# Patient Record
Sex: Female | Born: 1975 | Race: White | Hispanic: No | Marital: Married | Smoking: Never smoker
Health system: Southern US, Community
[De-identification: ages and names within clinical notes are randomized; demographics above are authoritative.]

## PROBLEM LIST (undated history)

## (undated) DIAGNOSIS — D649 Anemia, unspecified: Secondary | ICD-10-CM

## (undated) DIAGNOSIS — J45909 Unspecified asthma, uncomplicated: Secondary | ICD-10-CM

## (undated) DIAGNOSIS — M722 Plantar fascial fibromatosis: Secondary | ICD-10-CM

## (undated) DIAGNOSIS — K219 Gastro-esophageal reflux disease without esophagitis: Secondary | ICD-10-CM

## (undated) DIAGNOSIS — T7840XA Allergy, unspecified, initial encounter: Secondary | ICD-10-CM

## (undated) HISTORY — DX: Plantar fascial fibromatosis: M72.2

## (undated) HISTORY — DX: Anemia, unspecified: D64.9

## (undated) HISTORY — PX: FRACTURE SURGERY: SHX138

## (undated) HISTORY — DX: Unspecified asthma, uncomplicated: J45.909

## (undated) HISTORY — DX: Allergy, unspecified, initial encounter: T78.40XA

## (undated) HISTORY — DX: Gastro-esophageal reflux disease without esophagitis: K21.9

---

## 2005-10-09 HISTORY — PX: FOOT SURGERY: SHX648

## 2014-10-09 HISTORY — PX: OTHER SURGICAL HISTORY: SHX169

## 2020-01-01 DIAGNOSIS — Z23 Encounter for immunization: Secondary | ICD-10-CM | POA: Diagnosis not present

## 2020-01-24 DIAGNOSIS — Z23 Encounter for immunization: Secondary | ICD-10-CM | POA: Diagnosis not present

## 2020-07-28 ENCOUNTER — Ambulatory Visit (INDEPENDENT_AMBULATORY_CARE_PROVIDER_SITE_OTHER): Payer: BC Managed Care – PPO | Admitting: Physician Assistant

## 2020-07-28 ENCOUNTER — Other Ambulatory Visit: Payer: Self-pay

## 2020-07-28 ENCOUNTER — Encounter: Payer: Self-pay | Admitting: Physician Assistant

## 2020-07-28 VITALS — BP 120/90 | HR 75 | Temp 97.9°F | Ht 66.25 in | Wt 273.2 lb

## 2020-07-28 DIAGNOSIS — R21 Rash and other nonspecific skin eruption: Secondary | ICD-10-CM | POA: Diagnosis not present

## 2020-07-28 DIAGNOSIS — Z23 Encounter for immunization: Secondary | ICD-10-CM

## 2020-07-28 DIAGNOSIS — H6983 Other specified disorders of Eustachian tube, bilateral: Secondary | ICD-10-CM | POA: Diagnosis not present

## 2020-07-28 DIAGNOSIS — R0989 Other specified symptoms and signs involving the circulatory and respiratory systems: Secondary | ICD-10-CM | POA: Diagnosis not present

## 2020-07-28 DIAGNOSIS — J4530 Mild persistent asthma, uncomplicated: Secondary | ICD-10-CM | POA: Diagnosis not present

## 2020-07-28 MED ORDER — FLOVENT HFA 110 MCG/ACT IN AERO: 1.0000 | INHALATION_SPRAY | Freq: Two times a day (BID) | RESPIRATORY_TRACT | 12 refills | Status: DC

## 2020-07-28 MED ORDER — TRIAMCINOLONE ACETONIDE 0.1 % EX CREA: TOPICAL_CREAM | CUTANEOUS | 0 refills | Status: DC

## 2020-07-28 MED ORDER — MONTELUKAST SODIUM 10 MG PO TABS: 10.0000 mg | ORAL_TABLET | Freq: Every day | ORAL | 2 refills | Status: DC

## 2020-07-28 MED ORDER — ALBUTEROL SULFATE HFA 108 (90 BASE) MCG/ACT IN AERS: 2.0000 | INHALATION_SPRAY | Freq: Four times a day (QID) | RESPIRATORY_TRACT | 5 refills | Status: DC | PRN

## 2020-07-28 NOTE — Progress Notes (Signed)
Kristina Garza is a 44 y.o. female is here to establish care and discuss asthma.  I acted as a Neurosurgeon for [employer], PA-C Corky Mull, LPN   History of Present Illness:   Chief Complaint  Patient presents with  . Establish Care  . Asthma  . Rash    HPI   Pt is here to establish care today.  Asthma Pt was dx in 2016, she would like to discuss medications for her asthma. Pt has been off medications since May, was taking Singulair, Flovent diskus, Zyrtec, ProAir inhaler as needed. She is now currently taking ProAir prn and using less than weekly. She does continue to take Zyrtec. She has noticed that as fall has come around she has had increase in draining in her throat. When laughing she has to cough frequently. She has difficulty breathing at times when she is talking a lot, which she is required to do for her job.   She does note that that if she eats certain sauces, she gets excessive mucous production that causes coughing/choking. This can happen with ranch dressing or other sauces such as Japanese white cause. Will sometimes have to use her inhaler during these episodes.  Prior asthma meds include QVAR -- made her gums bleed  Rash Pt c/o rash left ring finger x several days. Red and itching, painful. This is in the location of wear her wedding ring was. She was recently active and thinks that she had increased sweating in this area and it caused irritation. She has had this in the past with metals. Has not tried anything for her symptoms.  Ear fullness Uses headset to do her job. She has issues with sensation of ear fullness. She denies pain, recent URI, drainage from ears.  Health Maintenance Due  Topic Date Due  . Hepatitis C Screening  Never done  . HIV Screening  Never done  . PAP SMEAR-Modifier  Never done  . INFLUENZA VACCINE  Never done    Past Medical History:  Diagnosis Date  . Allergy   . Asthma      Social History   Tobacco Use  . Smoking  status: Never Smoker  . Smokeless tobacco: Never Used  Vaping Use  . Vaping Use: Never used  Substance Use Topics  . Alcohol use: Yes    Alcohol/week: 0.0 - 2.0 standard drinks  . Drug use: Never    Past Surgical History:  Procedure Laterality Date  . FOOT SURGERY Left 2007   5th Metatarsal broken pin placed  . Knee surgery Right 2016   miniscus removed    Family History  Problem Relation Age of Onset  . Asthma Mother   . Osteoarthritis Mother   . Hyperlipidemia Father   . Hypertension Father   . Heart attack Father   . Hyperlipidemia Paternal Grandmother   . Hypertension Paternal Grandmother   . Heart attack Paternal Grandmother     PMHx, SurgHx, SocialHx, FamHx, Medications, and Allergies were reviewed in the Visit Navigator and updated as appropriate.   There are no problems to display for this patient.   Social History   Tobacco Use  . Smoking status: Never Smoker  . Smokeless tobacco: Never Used  Vaping Use  . Vaping Use: Never used  Substance Use Topics  . Alcohol use: Yes    Alcohol/week: 0.0 - 2.0 standard drinks  . Drug use: Never    Current Medications and Allergies:    Current Outpatient Medications:  .  albuterol (PROAIR HFA) 108 (90 Base) MCG/ACT inhaler, Inhale 2 puffs into the lungs every 6 (six) hours as needed for wheezing or shortness of breath., Disp: 8 g, Rfl: 5 .  cetirizine (ZYRTEC ALLERGY) 10 MG tablet, , Disp: , Rfl:  .  fluticasone (FLOVENT HFA) 110 MCG/ACT inhaler, Inhale 1 puff into the lungs in the morning and at bedtime., Disp: 1 each, Rfl: 12 .  montelukast (SINGULAIR) 10 MG tablet, Take 1 tablet (10 mg total) by mouth at bedtime., Disp: 90 tablet, Rfl: 2 .  triamcinolone cream (KENALOG) 0.1 %, Apply to affected area 1-2 times daily, Disp: 30 g, Rfl: 0   Allergies  Allergen Reactions  . Pollen Extract Cough and Shortness Of Breath    Review of Systems   ROS  Negative unless otherwise specified per HPI.  Vitals:    Vitals:   07/28/20 0901  BP: 120/90  Pulse: 75  Temp: 97.9 F (36.6 C)  TempSrc: Temporal  SpO2: 98%  Weight: 273 lb 4 oz (123.9 kg)  Height: 5' 6.25" (1.683 m)     Body mass index is 43.77 kg/m.   Physical Exam:    Physical Exam Vitals and nursing note reviewed.  Constitutional:      General: She is not in acute distress.    Appearance: She is well-developed. She is not ill-appearing or toxic-appearing.  HENT:     Right Ear: Ear canal and external ear normal. A middle ear effusion is present. Tympanic membrane is not perforated, erythematous or retracted.     Left Ear: Ear canal and external ear normal. A middle ear effusion is present. Tympanic membrane is not perforated, erythematous or retracted.  Cardiovascular:     Rate and Rhythm: Normal rate and regular rhythm.     Pulses: Normal pulses.     Heart sounds: Normal heart sounds, S1 normal and S2 normal.     Comments: No LE edema Pulmonary:     Effort: Pulmonary effort is normal.     Breath sounds: Normal breath sounds.  Skin:    General: Skin is warm and dry.     Comments: Well demarcated erythematous linear area at base of 4th finger  Neurological:     Mental Status: She is alert.     GCS: GCS eye subscore is 4. GCS verbal subscore is 5. GCS motor subscore is 6.  Psychiatric:        Speech: Speech normal.        Behavior: Behavior normal. Behavior is cooperative.      Assessment and Plan:    Willisha was seen today for establish care, asthma and rash.  Diagnoses and all orders for this visit:  Mild persistent asthma without complication Uncontrolled. Restart medications. Will have her change zyrtec to xyzal. Flovent BID, Proair prn. Singulair 10 mg daily. Follow-up if symptoms unmanageable with this regimen or Proair use becomes > 2-3 times weekly.  Rash Suspect contact dermatitis. Topical triamcinolone. If no improvement, follow-up.  Dysfunction of both eustachian tubes No evidence of  infection. Discussed pathophysiology of ETD.  Will trial flonase x 1 week, then prn use. If no improvement, follow-up.  Choking sensation Question possible eosonophilic esophagitis? Referral to GI.  Other orders -     Discontinue: albuterol (PROAIR HFA) 108 (90 Base) MCG/ACT inhaler; Inhale 2 puffs into the lungs every 6 (six) hours as needed for wheezing or shortness of breath. -     Discontinue: fluticasone (FLOVENT HFA) 110 MCG/ACT inhaler; Inhale 1 puff  into the lungs in the morning and at bedtime. -     Discontinue: montelukast (SINGULAIR) 10 MG tablet; Take 1 tablet (10 mg total) by mouth at bedtime. -     triamcinolone cream (KENALOG) 0.1 %; Apply to affected area 1-2 times daily -     albuterol (PROAIR HFA) 108 (90 Base) MCG/ACT inhaler; Inhale 2 puffs into the lungs every 6 (six) hours as needed for wheezing or shortness of breath. -     fluticasone (FLOVENT HFA) 110 MCG/ACT inhaler; Inhale 1 puff into the lungs in the morning and at bedtime. -     montelukast (SINGULAIR) 10 MG tablet; Take 1 tablet (10 mg total) by mouth at bedtime.  CMA or LPN served as scribe during this visit. History, Physical, and Plan performed by medical provider. The above documentation has been reviewed and is accurate and complete.   Jarold Motto, PA-C [place] 07/28/2020  Follow-up: No follow-ups on file.

## 2020-07-28 NOTE — Patient Instructions (Addendum)
It was great meeting you today!  Let's do a referral to GI, they can discuss your swallowing issues and also discuss plans for colon cancer screening.  In regards to asthma, we will refill all medications today: - Flovent - Proair - Singular  Switch Xyzal (levocetirizine) daily as needed. May take twice a day if needed as long as it does not cause drowsiness.  Trial Flonase 1 spray in both nostrils in AM and PM x 1 week to help with your symptoms  Follow-up at your convenience for PAP and physical   Eustachian Tube Dysfunction  Eustachian tube dysfunction refers to a condition in which a blockage develops in the narrow passage that connects the middle ear to the back of the nose (eustachian tube). The eustachian tube regulates air pressure in the middle ear by letting air move between the ear and nose. It also helps to drain fluid from the middle ear space. Eustachian tube dysfunction can affect one or both ears. When the eustachian tube does not function properly, air pressure, fluid, or both can build up in the middle ear. What are the causes? This condition occurs when the eustachian tube becomes blocked or cannot open normally. Common causes of this condition include:  Ear infections.  Colds and other infections that affect the nose, mouth, and throat (upper respiratory tract).  Allergies.  Irritation from cigarette smoke.  Irritation from stomach acid coming up into the esophagus (gastroesophageal reflux). The esophagus is the tube that carries food from the mouth to the stomach.  Sudden changes in air pressure, such as from descending in an airplane or scuba diving.  Abnormal growths in the nose or throat, such as: ? Growths that line the nose (nasal polyps). ? Abnormal growth of cells (tumors). ? Enlarged tissue at the back of the throat (adenoids). What increases the risk? You are more likely to develop this condition if:  You smoke.  You are overweight.  You are  a child who has: ? Certain birth defects of the mouth, such as cleft palate. ? Large tonsils or adenoids. What are the signs or symptoms? Common symptoms of this condition include:  A feeling of fullness in the ear.  Ear pain.  Clicking or popping noises in the ear.  Ringing in the ear.  Hearing loss.  Loss of balance.  Dizziness. Symptoms may get worse when the air pressure around you changes, such as when you travel to an area of high elevation, fly on an airplane, or go scuba diving. How is this diagnosed? This condition may be diagnosed based on:  Your symptoms.  A physical exam of your ears, nose, and throat.  Tests, such as those that measure: ? The movement of your eardrum (tympanogram). ? Your hearing (audiometry). How is this treated? Treatment depends on the cause and severity of your condition.  In mild cases, you may relieve your symptoms by moving air into your ears. This is called "popping the ears."  In more severe cases, or if you have symptoms of fluid in your ears, treatment may include: ? Medicines to relieve congestion (decongestants). ? Medicines that treat allergies (antihistamines). ? Nasal sprays or ear drops that contain medicines that reduce swelling (steroids). ? A procedure to drain the fluid in your eardrum (myringotomy). In this procedure, a small tube is placed in the eardrum to:  Drain the fluid.  Restore the air in the middle ear space. ? A procedure to insert a balloon device through the nose to  inflate the opening of the eustachian tube (balloon dilation). Follow these instructions at home: Lifestyle  Do not do any of the following until your health care provider approves: ? Travel to high altitudes. ? Fly in airplanes. ? Work in a Estate agent or room. ? Scuba dive.  Do not use any products that contain nicotine or tobacco, such as cigarettes and e-cigarettes. If you need help quitting, ask your health care  provider.  Keep your ears dry. Wear fitted earplugs during showering and bathing. Dry your ears completely after. General instructions  Take over-the-counter and prescription medicines only as told by your health care provider.  Use techniques to help pop your ears as recommended by your health care provider. These may include: ? Chewing gum. ? Yawning. ? Frequent, forceful swallowing. ? Closing your mouth, holding your nose closed, and gently blowing as if you are trying to blow air out of your nose.  Keep all follow-up visits as told by your health care provider. This is important. Contact a health care provider if:  Your symptoms do not go away after treatment.  Your symptoms come back after treatment.  You are unable to pop your ears.  You have: ? A fever. ? Pain in your ear. ? Pain in your head or neck. ? Fluid draining from your ear.  Your hearing suddenly changes.  You become very dizzy.  You lose your balance. Summary  Eustachian tube dysfunction refers to a condition in which a blockage develops in the eustachian tube.  It can be caused by ear infections, allergies, inhaled irritants, or abnormal growths in the nose or throat.  Symptoms include ear pain, hearing loss, or ringing in the ears.  Mild cases are treated with maneuvers to unblock the ears, such as yawning or ear popping.  Severe cases are treated with medicines. Surgery may also be done (rare). This information is not intended to replace advice given to you by your health care provider. Make sure you discuss any questions you have with your health care provider. Document Revised: 01/15/2018 Document Reviewed: 01/15/2018 Elsevier Patient Education  2020 [employer].

## 2020-08-17 ENCOUNTER — Encounter: Payer: Self-pay | Admitting: Physician Assistant

## 2020-08-27 ENCOUNTER — Encounter: Payer: BC Managed Care – PPO | Admitting: Physician Assistant

## 2020-08-31 ENCOUNTER — Ambulatory Visit (INDEPENDENT_AMBULATORY_CARE_PROVIDER_SITE_OTHER): Payer: BC Managed Care – PPO | Admitting: Physician Assistant

## 2020-08-31 ENCOUNTER — Other Ambulatory Visit: Payer: Self-pay

## 2020-08-31 ENCOUNTER — Encounter: Payer: Self-pay | Admitting: Physician Assistant

## 2020-08-31 ENCOUNTER — Other Ambulatory Visit (HOSPITAL_COMMUNITY)
Admission: RE | Admit: 2020-08-31 | Discharge: 2020-08-31 | Disposition: A | Discharge status: Home / Self Care | Payer: BC Managed Care – PPO | Source: Ambulatory Visit | Attending: Physician Assistant | Admitting: Physician Assistant

## 2020-08-31 VITALS — BP 130/86 | HR 73 | Temp 97.9°F | Ht 66.5 in | Wt 268.5 lb

## 2020-08-31 DIAGNOSIS — E669 Obesity, unspecified: Secondary | ICD-10-CM

## 2020-08-31 DIAGNOSIS — Z136 Encounter for screening for cardiovascular disorders: Secondary | ICD-10-CM | POA: Diagnosis not present

## 2020-08-31 DIAGNOSIS — Z0000 Encounter for general adult medical examination without abnormal findings: Secondary | ICD-10-CM

## 2020-08-31 DIAGNOSIS — Z13220 Encounter for screening for lipoid disorders: Secondary | ICD-10-CM | POA: Diagnosis not present

## 2020-08-31 DIAGNOSIS — J4530 Mild persistent asthma, uncomplicated: Secondary | ICD-10-CM

## 2020-08-31 DIAGNOSIS — Z124 Encounter for screening for malignant neoplasm of cervix: Secondary | ICD-10-CM

## 2020-08-31 LAB — LIPID PANEL
Cholesterol: 134 mg/dL (ref <200)
HDL: 36 mg/dL — ABNORMAL LOW (ref >=50)
LDL Cholesterol (Calc): 78 mg/dL (calc)
Non-HDL Cholesterol (Calc): 98 mg/dL (calc) (ref <130)
Total CHOL/HDL Ratio: 3.7 (calc) (ref <5.0)
Triglycerides: 113 mg/dL (ref <150)

## 2020-08-31 LAB — COMPREHENSIVE METABOLIC PANEL
AG Ratio: 1.4 (calc) (ref 1.0–2.5)
ALT: 21 U/L (ref 6–29)
AST: 18 U/L (ref 10–30)
Albumin: 3.9 g/dL (ref 3.6–5.1)
Alkaline phosphatase (APISO): 75 U/L (ref 31–125)
BUN: 13 mg/dL (ref 7–25)
CO2: 27 mmol/L (ref 20–32)
Calcium: 8.9 mg/dL (ref 8.6–10.2)
Chloride: 104 mmol/L (ref 98–110)
Creat: 0.75 mg/dL (ref 0.50–1.10)
Globulin: 2.7 g/dL (calc) (ref 1.9–3.7)
Glucose, Bld: 105 mg/dL — ABNORMAL HIGH (ref 65–99)
Potassium: 4.3 mmol/L (ref 3.5–5.3)
Sodium: 137 mmol/L (ref 135–146)
Total Bilirubin: 0.5 mg/dL (ref 0.2–1.2)
Total Protein: 6.6 g/dL (ref 6.1–8.1)

## 2020-08-31 LAB — CBC WITH DIFFERENTIAL/PLATELET
Absolute Monocytes: 507 cells/uL (ref 200–950)
Basophils Absolute: 41 cells/uL (ref 0–200)
Basophils Relative: 0.7 %
Eosinophils Absolute: 201 cells/uL (ref 15–500)
Eosinophils Relative: 3.4 %
HCT: 39.7 % (ref 35.0–45.0)
Hemoglobin: 13.4 g/dL (ref 11.7–15.5)
Lymphs Abs: 1723 cells/uL (ref 850–3900)
MCH: 30.7 pg (ref 27.0–33.0)
MCHC: 33.8 g/dL (ref 32.0–36.0)
MCV: 90.8 fL (ref 80.0–100.0)
MPV: 10.2 fL (ref 7.5–12.5)
Monocytes Relative: 8.6 %
Neutro Abs: 3428 cells/uL (ref 1500–7800)
Neutrophils Relative %: 58.1 %
Platelets: 306 10*3/uL (ref 140–400)
RBC: 4.37 10*6/uL (ref 3.80–5.10)
RDW: 12.3 % (ref 11.0–15.0)
Total Lymphocyte: 29.2 %
WBC: 5.9 10*3/uL (ref 3.8–10.8)

## 2020-08-31 NOTE — Progress Notes (Signed)
I acted as a Neurosurgeon for [employer], PA-C Kristina Mull, LPN    Subjective:    Kristina Garza is a 44 y.o. female and is here for a comprehensive physical exam.   HPI  Health Maintenance Due  Topic Date Due  . MAMMOGRAM  Never done  . PAP SMEAR-Modifier  Never done    Acute Concerns: None  Chronic Issues: Asthma -- doing well on current regimen. Flovent HFA, Albuterol prn, Singulair 10 mg and xyzal.  Health Maintenance: Immunizations -- UTD Colonoscopy -- will defer to next year Mammogram -- never, will order PAP -- due, will do today. Bone Density -- N/A Diet -- working on portion reduction; drinks water Caffeine intake -- none excessive; maybe 1 per day max Sleep habits -- no significant concerns Exercise -- gets a lot of activity at her land Weight -- Weight: 268 lb 8 oz (121.8 kg)  Mood -- no concerns Weight history: Wt Readings from Last 10 Encounters:  08/31/20 268 lb 8 oz (121.8 kg)  07/28/20 273 lb 4 oz (123.9 kg)   Body mass index is 42.69 kg/m. Patient's last menstrual period was 08/25/2020. Period characteristics: most regular Alcohol use: 0-2 glasses per week Tobacco use: never  Depression screen PHQ 2/9 08/31/2020  Decreased Interest 0  Down, Depressed, Hopeless 0  PHQ - 2 Score 0     Other providers/specialists: Patient Care Team: Kristina Garza, [place] as PCP - General (Physician Assistant)    PMHx, SurgHx, SocialHx, Medications, and Allergies were reviewed in the Visit Navigator and updated as appropriate.   Past Medical History:  Diagnosis Date  . Allergy   . Asthma      Past Surgical History:  Procedure Laterality Date  . FOOT SURGERY Left 2007   5th Metatarsal broken pin placed  . Knee surgery Right 2016   miniscus removed     Family History  Problem Relation Age of Onset  . Asthma Mother   . Osteoarthritis Mother   . Hyperlipidemia Father   . Hypertension Father   . Heart attack Father   . Sleep apnea Father    . Hyperlipidemia Paternal Grandmother   . Hypertension Paternal Grandmother   . Heart attack Paternal Grandmother   . Breast cancer Neg Hx     Social History   Tobacco Use  . Smoking status: Never Smoker  . Smokeless tobacco: Never Used  Vaping Use  . Vaping Use: Never used  Substance Use Topics  . Alcohol use: Yes    Alcohol/week: 0.0 - 2.0 standard drinks  . Drug use: Never    Review of Systems:   Review of Systems  Constitutional: Negative for chills, fever, malaise/fatigue and weight loss.  HENT: Negative for hearing loss, sinus pain and sore throat.   Eyes: Negative for blurred vision.  Respiratory: Negative for cough and shortness of breath.   Cardiovascular: Negative for chest pain, palpitations and leg swelling.  Gastrointestinal: Negative for abdominal pain, constipation, diarrhea, heartburn, nausea and vomiting.  Genitourinary: Negative for dysuria, frequency and urgency.  Musculoskeletal: Negative for back pain, myalgias and neck pain.  Skin: Negative for itching and rash.  Neurological: Negative for dizziness, tingling, seizures, loss of consciousness and headaches.  Endo/Heme/Allergies: Negative for polydipsia.  Psychiatric/Behavioral: Negative for depression. The patient is not nervous/anxious.   All other systems reviewed and are negative.   Objective:   BP 130/86 (BP Location: Left Arm, Patient Position: Sitting, Cuff Size: Large)   Pulse 73   Temp 97.9  F (36.6 C) (Temporal)   Ht 5' 6.5" (1.689 m)   Wt 268 lb 8 oz (121.8 kg)   LMP 08/25/2020   SpO2 96%   BMI 42.69 kg/m  Body mass index is 42.69 kg/m.   General Appearance:    Alert, cooperative, no distress, appears stated age  Head:    Normocephalic, without obvious abnormality, atraumatic  Eyes:    PERRL, conjunctiva/corneas clear, EOM's intact, fundi    benign, both eyes  Ears:    Normal TM's and external ear canals, both ears  Nose:   Nares normal, septum midline, mucosa normal, no  drainage    or sinus tenderness  Throat:   Lips, mucosa, and tongue normal; teeth and gums normal  Neck:   Supple, symmetrical, trachea midline, no adenopathy;    thyroid:  no enlargement/tenderness/nodules; no carotid   bruit or JVD  Back:     Symmetric, no curvature, ROM normal, no CVA tenderness  Lungs:     Clear to auscultation bilaterally, respirations unlabored  Chest Wall:    No tenderness or deformity   Heart:    Regular rate and rhythm, S1 and S2 normal, no murmur, rub or gallop  Breast Exam:    No tenderness, masses, or nipple abnormality  Abdomen:     Soft, non-tender, bowel sounds active all four quadrants,    no masses, no organomegaly  Genitalia:    Normal female without lesion, discharge or tenderness  Extremities:   Extremities normal, atraumatic, no cyanosis or edema  Pulses:   2+ and symmetric all extremities  Skin:   Skin color, texture, turgor normal, no rashes or lesions  Lymph nodes:   Cervical, supraclavicular, and axillary nodes normal  Neurologic:   CNII-XII intact, normal strength, sensation and reflexes    throughout   No results found for this or any previous visit.  Assessment/Plan:   Kristina Garza was seen today for annual exam.  Diagnoses and all orders for this visit:  Routine physical examination Today patient counseled on age appropriate routine health concerns for screening and prevention, each reviewed and up to date or declined. Immunizations reviewed and up to date or declined. Labs ordered and reviewed. Risk factors for depression reviewed and negative. Hearing function and visual acuity are intact. ADLs screened and addressed as needed. Functional ability and level of safety reviewed and appropriate. Education, counseling and referrals performed based on assessed risks today. Patient provided with a copy of personalized plan for preventive services.  Mild persistent asthma without complication Well controlled per patient. Continue Flovent daily,  Albuterol prn, singulair 10 mg daily, xyzal 5 mg. Continue to monitor. -     CBC with Differential/Platelet; Future -     Comprehensive metabolic panel; Future  Encounter for lipid screening for cardiovascular disease -     Lipid panel; Future  Pap smear for cervical cancer screening -     Cytology - PAP  Obesity, unspecified classification, unspecified obesity type, unspecified whether serious comorbidity present Encouraged healthy diet and exercise.   Well Adult Exam: Labs ordered: Yes. Patient counseling was done. See below for items discussed. Discussed the patient's BMI.  The BMI is not in the acceptable range; BMI management plan is completed Follow up as needed for acute illness. Breast cancer screening: encouraged. Cervical cancer screening: done today.   Patient Counseling: [x]    Nutrition: Stressed importance of moderation in sodium/caffeine intake, saturated fat and cholesterol, caloric balance, sufficient intake of fresh fruits, vegetables, fiber, calcium, iron, and  1 mg of folate supplement per day (for females capable of pregnancy).  [x]    Stressed the importance of regular exercise.   [x]    Substance Abuse: Discussed cessation/primary prevention of tobacco, alcohol, or other drug use; driving or other dangerous activities under the influence; availability of treatment for abuse.   [x]    Injury prevention: Discussed safety belts, safety helmets, smoke detector, smoking near bedding or upholstery.   [x]    Sexuality: Discussed sexually transmitted diseases, partner selection, use of condoms, avoidance of unintended pregnancy  and contraceptive alternatives.  [x]    Dental health: Discussed importance of regular tooth brushing, flossing, and dental visits.  [x]    Health maintenance and immunizations reviewed. Please refer to Health maintenance section.   CMA or LPN served as scribe during this visit. History, Physical, and Plan performed by medical provider. The above documentation  has been reviewed and is accurate and complete.  , PA-C [place] [facility]

## 2020-08-31 NOTE — Patient Instructions (Addendum)
It was great to see you!  Please get a mammogram.  Please go to the lab for blood work.   Our office will call you with your results unless you have chosen to receive results via MyChart.  If your blood work is normal we will follow-up each year for physicals and as scheduled for chronic medical problems.  If anything is abnormal we will treat accordingly and get you in for a follow-up.  Take care,  [facility] Maintenance, Female Adopting a healthy lifestyle and getting preventive care are important in promoting health and wellness. Ask your health care provider about:  The right schedule for you to have regular tests and exams.  Things you can do on your own to prevent diseases and keep yourself healthy. What should I know about diet, weight, and exercise? Eat a healthy diet   Eat a diet that includes plenty of vegetables, fruits, low-fat dairy products, and lean protein.  Do not eat a lot of foods that are high in solid fats, added sugars, or sodium. Maintain a healthy weight Body mass index (BMI) is used to identify weight problems. It estimates body fat based on height and weight. Your health care provider can help determine your BMI and help you achieve or maintain a healthy weight. Get regular exercise Get regular exercise. This is one of the most important things you can do for your health. Most adults should:  Exercise for at least 150 minutes each week. The exercise should increase your heart rate and make you sweat (moderate-intensity exercise).  Do strengthening exercises at least twice a week. This is in addition to the moderate-intensity exercise.  Spend less time sitting. Even light physical activity can be beneficial. Watch cholesterol and blood lipids Have your blood tested for lipids and cholesterol at 44 years of age, then have this test every 5 years. Have your cholesterol levels checked more often if:  Your lipid or cholesterol levels are  high.  You are older than 44 years of age.  You are at high risk for heart disease. What should I know about cancer screening? Depending on your health history and family history, you may need to have cancer screening at various ages. This may include screening for:  Breast cancer.  Cervical cancer.  Colorectal cancer.  Skin cancer.  Lung cancer. What should I know about heart disease, diabetes, and high blood pressure? Blood pressure and heart disease  High blood pressure causes heart disease and increases the risk of stroke. This is more likely to develop in people who have high blood pressure readings, are of African descent, or are overweight.  Have your blood pressure checked: ? Every 3-5 years if you are 53-4 years of age. ? Every year if you are 61 years old or older. Diabetes Have regular diabetes screenings. This checks your fasting blood sugar level. Have the screening done:  Once every three years after age 18 if you are at a normal weight and have a low risk for diabetes.  More often and at a younger age if you are overweight or have a high risk for diabetes. What should I know about preventing infection? Hepatitis B If you have a higher risk for hepatitis B, you should be screened for this virus. Talk with your health care provider to find out if you are at risk for hepatitis B infection. Hepatitis C Testing is recommended for:  Everyone born from [birth year] through 1965.  Anyone with known risk factors  for hepatitis C. Sexually transmitted infections (STIs)  Get screened for STIs, including gonorrhea and chlamydia, if: ? You are sexually active and are younger than 44 years of age. ? You are older than 44 years of age and your health care provider tells you that you are at risk for this type of infection. ? Your sexual activity has changed since you were last screened, and you are at increased risk for chlamydia or gonorrhea. Ask your health care provider if you  are at risk.  Ask your health care provider about whether you are at high risk for HIV. Your health care provider may recommend a prescription medicine to help prevent HIV infection. If you choose to take medicine to prevent HIV, you should first get tested for HIV. You should then be tested every 3 months for as long as you are taking the medicine. Pregnancy  If you are about to stop having your period (premenopausal) and you may become pregnant, seek counseling before you get pregnant.  Take 400 to 800 micrograms (mcg) of folic acid every day if you become pregnant.  Ask for birth control (contraception) if you want to prevent pregnancy. Osteoporosis and menopause Osteoporosis is a disease in which the bones lose minerals and strength with aging. This can result in bone fractures. If you are 11 years old or older, or if you are at risk for osteoporosis and fractures, ask your health care provider if you should:  Be screened for bone loss.  Take a calcium or vitamin D supplement to lower your risk of fractures.  Be given hormone replacement therapy (HRT) to treat symptoms of menopause. Follow these instructions at home: Lifestyle  Do not use any products that contain nicotine or tobacco, such as cigarettes, e-cigarettes, and chewing tobacco. If you need help quitting, ask your health care provider.  Do not use street drugs.  Do not share needles.  Ask your health care provider for help if you need support or information about quitting drugs. Alcohol use  Do not drink alcohol if: ? Your health care provider tells you not to drink. ? You are pregnant, may be pregnant, or are planning to become pregnant.  If you drink alcohol: ? Limit how much you use to 0-1 drink a day. ? Limit intake if you are breastfeeding.  Be aware of how much alcohol is in your drink. In the [place] one drink equals one 12 oz bottle of beer (355 mL), one 5 oz glass of wine (148 mL), or one 1 oz glass of hard  liquor (44 mL). General instructions  Schedule regular health, dental, and eye exams.  Stay current with your vaccines.  Tell your health care provider if: ? You often feel depressed. ? You have ever been abused or do not feel safe at home. Summary  Adopting a healthy lifestyle and getting preventive care are important in promoting health and wellness.  Follow your health care provider's instructions about healthy diet, exercising, and getting tested or screened for diseases.  Follow your health care provider's instructions on monitoring your cholesterol and blood pressure. This information is not intended to replace advice given to you by your health care provider. Make sure you discuss any questions you have with your health care provider. Document Revised: 09/18/2018 Document Reviewed: 09/18/2018 Elsevier Patient Education  2020 [employer].

## 2020-09-06 LAB — CYTOLOGY - PAP
Comment: NEGATIVE
Diagnosis: NEGATIVE
High risk HPV: NEGATIVE

## 2020-09-07 ENCOUNTER — Ambulatory Visit (INDEPENDENT_AMBULATORY_CARE_PROVIDER_SITE_OTHER): Payer: BC Managed Care – PPO | Admitting: Physician Assistant

## 2020-09-07 ENCOUNTER — Encounter: Payer: Self-pay | Admitting: Physician Assistant

## 2020-09-07 VITALS — BP 122/60 | HR 74 | Ht 66.5 in | Wt 271.2 lb

## 2020-09-07 DIAGNOSIS — T17308A Unspecified foreign body in larynx causing other injury, initial encounter: Secondary | ICD-10-CM

## 2020-09-07 DIAGNOSIS — R059 Cough, unspecified: Secondary | ICD-10-CM | POA: Diagnosis not present

## 2020-09-07 DIAGNOSIS — J45909 Unspecified asthma, uncomplicated: Secondary | ICD-10-CM | POA: Diagnosis not present

## 2020-09-07 NOTE — Progress Notes (Signed)
Subjective:    Patient ID: Kristina Garza, female    DOB: [birth year], 44 y.o.   MRN: [id number]  HPI Kristina Garza is a pleasant 44 year old white female, new to GI today referred by Jarold Motto, PA for further evaluation of dysphagia/choking. Patient has not had any prior GI evaluation.  She does have history of asthma. She reports that her current symptoms have been present over the past 6 to 8 months and have been occurring frequently.  She denies any true dysphagia to solids or liquids, and has not been having any regular heartburn or indigestion or sour brash. She relates that particular foods i.e. ranch dressing and white Japanese dipping sauce generally trigger her symptoms.  On careful questioning she also thinks that dairy will tend to trigger asthma symptoms.  She has no difficulty swallowing the offending dressings but says shortly after eating them she will have an asthma attack triggered with significant coughing and wheezing and sense of inability to get a breath. She has not had any prior food allergy testing, she says in the past her asthma symptoms had been attributed to multiple environmental sensitivities. Interestingly she had been off of asthma meds over the past 6 to 8 months and has recently been started back on Singulair and Xyzal and she has had significant improvement in symptoms, and much better tolerance to the above-mentioned food products. She is not aware of any sensation of aspiration with swallowing.  Review of Systems Pertinent positive and negative review of systems were noted in the above HPI section.  All other review of systems was otherwise negative.  Outpatient Encounter Medications as of 09/07/2020  Medication Sig  . albuterol (PROAIR HFA) 108 (90 Base) MCG/ACT inhaler Inhale 2 puffs into the lungs every 6 (six) hours as needed for wheezing or shortness of breath.  . fluticasone (FLOVENT HFA) 110 MCG/ACT inhaler Inhale 1 puff into the lungs in the morning and at  bedtime.  Marland Kitchen levocetirizine (XYZAL) 5 MG tablet Take 5 mg by mouth every evening.  . montelukast (SINGULAIR) 10 MG tablet Take 1 tablet (10 mg total) by mouth at bedtime.  . triamcinolone cream (KENALOG) 0.1 % Apply to affected area 1-2 times daily   No facility-administered encounter medications on file as of 09/07/2020.   Allergies  Allergen Reactions  . Pollen Extract Cough and Shortness Of Breath   Patient Active Problem List   Diagnosis Date Noted  . Obesity 08/31/2020  . Mild persistent asthma without complication 07/28/2020   Social History   Socioeconomic History  . Marital status: Married    Spouse name: Not on file  . Number of children: Not on file  . Years of education: Not on file  . Highest education level: Not on file  Occupational History  . Not on file  Tobacco Use  . Smoking status: Never Smoker  . Smokeless tobacco: Never Used  Vaping Use  . Vaping Use: Never used  Substance and Sexual Activity  . Alcohol use: Yes    Alcohol/week: 0.0 - 2.0 standard drinks  . Drug use: Never  . Sexual activity: Yes    Birth control/protection: None  Other Topics Concern  . Not on file  Social History Narrative   Married   Social Determinants of Health   Financial Resource Strain:   . Difficulty of Paying Living Expenses: Not on file  Food Insecurity:   . Worried About Programme researcher, broadcasting/film/video in the Last Year: Not on file  . Ran Out  of Food in the Last Year: Not on file  Transportation Needs:   . Lack of Transportation (Medical): Not on file  . Lack of Transportation (Non-Medical): Not on file  Physical Activity:   . Days of Exercise per Week: Not on file  . Minutes of Exercise per Session: Not on file  Stress:   . Feeling of Stress : Not on file  Social Connections:   . Frequency of Communication with Friends and Family: Not on file  . Frequency of Social Gatherings with Friends and Family: Not on file  . Attends Religious Services: Not on file  . Active  Member of Clubs or Organizations: Not on file  . Attends Banker Meetings: Not on file  . Marital Status: Not on file  Intimate Partner Violence:   . Fear of Current or Ex-Partner: Not on file  . Emotionally Abused: Not on file  . Physically Abused: Not on file  . Sexually Abused: Not on file    Ms. Raiche's family history includes Asthma in her mother; Heart attack in her father and paternal grandmother; Hyperlipidemia in her father and paternal grandmother; Hypertension in her father and paternal grandmother; Osteoarthritis in her mother; Sleep apnea in her father.      Objective:    Vitals:   09/07/20 1037  BP: 122/60  Pulse: 74    Physical Exam Well-developed well-nourished  WF  in no acute distress.  Height, Weight, 271 BMI 43.1  HEENT; nontraumatic normocephalic, EOMI, PER R LA, sclera anicteric. Oropharynx; not done Neck; supple, no JVD Cardiovascular; regular rate and rhythm with S1-S2, no murmur rub or gallop Pulmonary; Clear bilaterally Abdomen; soft, nontender, nondistended, no palpable mass or hepatosplenomegaly, bowel sounds are active Rectal; not done today Skin; benign exam, no jaundice rash or appreciable lesions Extremities; no clubbing cyanosis or edema skin warm and dry Neuro/Psych; alert and oriented x4, grossly nonfocal mood and affect appropriate       Assessment & Plan:   #21 44 year old white female with asthma, with 6 to 66-month history of exacerbation of asthma symptoms immediately after consuming certain food products i.e. ranch dressing and Japanese white sauce.  Within a few minutes of consuming these products she has had asthma attacks triggered with significant coughing wheezing and shortness of breath. At onset of symptoms and over the past 6 to 8 months she had not been on any maintenance medications for her asthma which have just recently been resumed and she seems to have significantly less reaction to the above-mentioned  foods.  No solid food dysphagia, and no true liquid dysphagia by history Rule out silent aspiration but doubt, rule out dysmotility  Plan;  Will schedule for barium swallow  Have   also replaced referral to Allergist for food allergy testing/[facility]. We also discussed colon cancer screening starting around age 62. Patient will be established with Dr. Orvan Falconer, further recommendations pending barium swallow.  Gillie Fleites S Sada Mazzoni PA-C 09/07/2020   Cc: Jarold Motto, [place]

## 2020-09-07 NOTE — Progress Notes (Signed)
Reviewed and agree with management plans. ? ?Airianna Kreischer L. Toba Claudio, MD, MPH  ?

## 2020-09-07 NOTE — Patient Instructions (Addendum)
If you are age 44 or older, your body mass index should be between 23-30. Your Body mass index is 43.13 kg/m. If this is out of the aforementioned range listed, please consider follow up with your Primary Care Provider.  If you are age 48 or younger, your body mass index should be between 19-25. Your Body mass index is 43.13 kg/m. If this is out of the aformentioned range listed, please consider follow up with your Primary Care Provider.    You have been scheduled for a Barium Esophogram at [facility] (1st floor of the hospital) on 09/21/20 at 9:30am. Please arrive 15 minutes prior to your appointment for registration. Make certain not to have anything to eat or drink 3 hours prior to your test. If you need to reschedule for any reason, please contact radiology at [phone] to do so. __________________________________________________________________ A barium swallow is an examination that concentrates on views of the esophagus. This tends to be a double contrast exam (barium and two liquids which, when combined, create a gas to distend the wall of the oesophagus) or single contrast (non-ionic iodine based). The study is usually tailored to your symptoms so a good history is essential. Attention is paid during the study to the form, structure and configuration of the esophagus, looking for functional disorders (such as aspiration, dysphagia, achalasia, motility and reflux) EXAMINATION You may be asked to change into a gown, depending on the type of swallow being performed. A radiologist and radiographer will perform the procedure. The radiologist will advise you of the type of contrast selected for your procedure and direct you during the exam. You will be asked to stand, sit or lie in several different positions and to hold a small amount of fluid in your mouth before being asked to swallow while the imaging is performed .In some instances you may be asked to swallow barium coated  marshmallows to assess the motility of a solid food bolus. The exam can be recorded as a digital or video fluoroscopy procedure. POST PROCEDURE It will take 1-2 days for the barium to pass through your system. To facilitate this, it is important, unless otherwise directed, to increase your fluids for the next 24-48hrs and to resume your normal diet.  This test typically takes about 30 minutes to perform. __________________________________________________________________________________  We have sent referral to Allergist. Dr. Malachi Bonds office. His office should contact you for an appointment, If you do not hear from his office in 1 week please contact them at [phone].   Thank you for choosing me and [facility].  Amy Esterwood PA

## 2020-09-21 ENCOUNTER — Ambulatory Visit (HOSPITAL_COMMUNITY): Payer: BC Managed Care – PPO

## 2020-10-18 ENCOUNTER — Other Ambulatory Visit: Payer: Self-pay

## 2020-10-18 ENCOUNTER — Other Ambulatory Visit: Payer: Self-pay | Admitting: Physician Assistant

## 2020-10-18 ENCOUNTER — Ambulatory Visit (HOSPITAL_COMMUNITY)
Admission: RE | Admit: 2020-10-18 | Discharge: 2020-10-18 | Disposition: A | Discharge status: Home / Self Care | Payer: BC Managed Care – PPO | Source: Ambulatory Visit | Attending: Physician Assistant | Admitting: Physician Assistant

## 2020-10-18 DIAGNOSIS — J45909 Unspecified asthma, uncomplicated: Secondary | ICD-10-CM

## 2020-10-18 DIAGNOSIS — R059 Cough, unspecified: Secondary | ICD-10-CM | POA: Diagnosis not present

## 2020-10-18 DIAGNOSIS — T17308A Unspecified foreign body in larynx causing other injury, initial encounter: Secondary | ICD-10-CM | POA: Insufficient documentation

## 2020-10-18 DIAGNOSIS — R1310 Dysphagia, unspecified: Secondary | ICD-10-CM | POA: Diagnosis not present

## 2020-10-29 ENCOUNTER — Ambulatory Visit: Payer: BC Managed Care – PPO | Admitting: Allergy & Immunology

## 2020-10-29 ENCOUNTER — Other Ambulatory Visit: Payer: Self-pay

## 2020-10-29 ENCOUNTER — Encounter: Payer: Self-pay | Admitting: Allergy & Immunology

## 2020-10-29 ENCOUNTER — Ambulatory Visit (INDEPENDENT_AMBULATORY_CARE_PROVIDER_SITE_OTHER): Payer: BC Managed Care – PPO | Admitting: Allergy & Immunology

## 2020-10-29 VITALS — BP 138/80 | HR 78 | Temp 97.9°F | Resp 18 | Ht 66.54 in | Wt 269.0 lb

## 2020-10-29 DIAGNOSIS — J302 Other seasonal allergic rhinitis: Secondary | ICD-10-CM | POA: Diagnosis not present

## 2020-10-29 DIAGNOSIS — J4530 Mild persistent asthma, uncomplicated: Secondary | ICD-10-CM

## 2020-10-29 DIAGNOSIS — T781XXD Other adverse food reactions, not elsewhere classified, subsequent encounter: Secondary | ICD-10-CM | POA: Diagnosis not present

## 2020-10-29 DIAGNOSIS — J3089 Other allergic rhinitis: Secondary | ICD-10-CM

## 2020-10-29 DIAGNOSIS — T63481D Toxic effect of venom of other arthropod, accidental (unintentional), subsequent encounter: Secondary | ICD-10-CM | POA: Diagnosis not present

## 2020-10-29 MED ORDER — EPINEPHRINE 0.3 MG/0.3ML IJ SOAJ: 0.3000 mg | Freq: Once | INTRAMUSCULAR | 2 refills | Status: DC

## 2020-10-29 NOTE — Patient Instructions (Addendum)
1. Mild persistent asthma, uncomplicated - Lung testing not done today. - We will do that next time.  - Spacer sample and demonstration provided. - Daily controller medication(s): Flovent 1 puff twice daily with spacer and Singulair (montelukast) 10 mg daily  - Prior to physical activity: albuterol 2 puffs 10-15 minutes before physical activity. - Rescue medications: albuterol 4 puffs every 4-6 hours as needed - Asthma control goals:  * Full participation in all desired activities (may need albuterol before activity) * Albuterol use two time or less a week on average (not counting use with activity) * Cough interfering with sleep two time or less a month * Oral steroids no more than once a year * No hospitalizations  2. Seasonal and perennial allergic rhinitis - Testing today showed: grasses, ragweed, weeds, trees, indoor molds, outdoor molds, dust mites, cat, dog and cockroach - Copy of test results provided.  - Avoidance measures provided. - Stop taking: Xyzal - Continue with: Singulair (montelukast) 10mg  daily and Flonase (fluticasone) one spray per nostril daily - Start taking: Ryvent (carbinoxamine) 6mg  tablet 3-4 times daily as needed - You can use an extra dose of the antihistamine, if needed, for breakthrough symptoms.  - Consider nasal saline rinses 1-2 times daily to remove allergens from the nasal cavities as well as help with mucous clearance (this is especially helpful to do before the nasal sprays are given) - Consider allergy shots as a means of long-term control. - Allergy shots "re-train" and "reset" the immune system to ignore environmental allergens and decrease the resulting immune response to those allergens (sneezing, itchy watery eyes, runny nose, nasal congestion, etc).    - Allergy shots improve symptoms in 75-85% of patients.  - Talk to your insurance company about allergy shots and call when you make a decision.  3. Adverse food reaction - Testing was  negative to all of the foods. - I anticipate that this reaction to the sauces is likely related to a non-allergy mediated reaction seen with milk containing foods.  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 - Anaphylaxis management plan provided. - EpiPen training provided.  5. Return in about 4 weeks (around 11/26/2020).    Please inform us of any Emergency Department visits, hospitalizations, or changes in symptoms. Call 11/28/2020 before going to the ED for breathing or allergy symptoms since we might be able to fit you in for a sick visit. Feel free to contact us anytime with any questions, problems, or concerns.  It was a pleasure to meet you today!  Websites that have reliable patient information: 1. American Academy of Asthma, Allergy, and Immunology: www.aaaai.org 2. Food Allergy Research and Education (FARE): foodallergy.org 3. Mothers of Asthmatics: http://www.asthmacommunitynetwork.org 4. American College of Allergy, Asthma, and Immunology: www.acaai.org   COVID-19 Vaccine Information can be found at: [place] For questions related to vaccine distribution or appointments, please email [email or web address] or call [phone].     "Like" [email or web address] on Facebook and Instagram for our latest updates!       Make sure you are registered to vote! If you have moved or changed any of your contact information, you will need to get this updated before voting!  In some cases, you MAY be able to register to vote online: [phone]     Reducing Pollen Exposure  The American Academy of Allergy, Asthma and Immunology suggests the following steps to reduce your exposure to pollen during allergy seasons.    1. Do  not hang sheets or clothing out to dry; pollen may collect on these items. 2. Do not mow lawns or spend time around freshly cut grass; mowing stirs up  pollen. 3. Keep windows closed at night.  Keep car windows closed while driving. 4. Minimize morning activities outdoors, a time when pollen counts are usually at their highest. 5. Stay indoors as much as possible when pollen counts or humidity is high and on windy days when pollen tends to remain in the air longer. 6. Use air conditioning when possible.  Many air conditioners have filters that trap the pollen spores. 7. Use a HEPA room air filter to remove pollen form the indoor air you breathe.  Control of Mold Allergen   Mold and fungi can grow on a variety of surfaces provided certain temperature and moisture conditions exist.  Outdoor molds grow on plants, decaying vegetation and soil.  The major outdoor mold, Alternaria and Cladosporium, are found in very high numbers during hot and dry conditions.  Generally, a late Summer - Fall peak is seen for common outdoor fungal spores.  Rain will temporarily lower outdoor mold spore count, but counts rise rapidly when the rainy period ends.  The most important indoor molds are Aspergillus and Penicillium.  Dark, humid and poorly ventilated basements are ideal sites for mold growth.  The next most common sites of mold growth are the bathroom and the kitchen.  Outdoor (Seasonal) Mold Control  Positive outdoor molds via skin testing: Bipolaris (Helminthsporium), Drechslera (Curvalaria) and Mucor  1. Use air conditioning and keep windows closed 2. Avoid exposure to decaying vegetation. 3. Avoid leaf raking. 4. Avoid grain handling. 5. Consider wearing a face mask if working in moldy areas.  6.   Indoor (Perennial) Mold Control   Positive indoor molds via skin testing: Aspergillus, Penicillium, Fusarium and Aureobasidium (Pullulara)  1. Maintain humidity below 50%. 2. Clean washable surfaces with 5% bleach solution. 3. Remove sources e.g. contaminated carpets.     Control of Dog or Cat Allergen  Avoidance is the best way to manage a dog or  cat allergy. If you have a dog or cat and are allergic to dog or cats, consider removing the dog or cat from the home. If you have a dog or cat but don't want to find it a new home, or if your family wants a pet even though someone in the household is allergic, here are some strategies that may help keep symptoms at bay:  1. Keep the pet out of your bedroom and restrict it to only a few rooms. Be advised that keeping the dog or cat in only one room will not limit the allergens to that room. 2. Don't pet, hug or kiss the dog or cat; if you do, wash your hands with soap and water. 3. High-efficiency particulate air (HEPA) cleaners run continuously in a bedroom or living room can reduce allergen levels over time. 4. Regular use of a high-efficiency vacuum cleaner or a central vacuum can reduce allergen levels. 5. Giving your dog or cat a bath at least once a week can reduce airborne allergen.  Control of Cockroach Allergen  Cockroach allergen has been identified as an important cause of acute attacks of asthma, especially in urban settings.  There are fifty-five species of cockroach that exist in the [place], however only three, the [place], [place] and Oriental species produce allergen that can affect patients with Asthma.  Allergens can be obtained from fecal particles, egg casings  and secretions from cockroaches.    1. Remove food sources. 2. Reduce access to water. 3. Seal access and entry points. 4. Spray runways with 0.5-1% Diazinon or Chlorpyrifos 5. Blow boric acid power under stoves and refrigerator. 6. Place bait stations (hydramethylnon) at feeding sites.  Control of Dust Mite Allergen    Dust mites play a major role in allergic asthma and rhinitis.  They occur in environments with high humidity wherever human skin is found.  Dust mites absorb humidity from the atmosphere (ie, they do not drink) and feed on organic matter (including shed human and animal skin).  Dust mites are a  microscopic type of insect that you cannot see with the naked eye.  High levels of dust mites have been detected from mattresses, pillows, carpets, upholstered furniture, bed covers, clothes, soft toys and any woven material.  The principal allergen of the dust mite is found in its feces.  A gram of dust may contain 1,000 mites and 250,000 fecal particles.  Mite antigen is easily measured in the air during house cleaning activities.  Dust mites do not bite and do not cause harm to humans, other than by triggering allergies/asthma.    Ways to decrease your exposure to dust mites in your home:  1. Encase mattresses, box springs and pillows with a mite-impermeable barrier or cover   2. Wash sheets, blankets and drapes weekly in hot water (130 F) with detergent and dry them in a dryer on the hot setting.  3. Have the room cleaned frequently with a vacuum cleaner and a damp dust-mop.  For carpeting or rugs, vacuuming with a vacuum cleaner equipped with a high-efficiency particulate air (HEPA) filter.  The dust mite allergic individual should not be in a room which is being cleaned and should wait 1 hour after cleaning before going into the room. 4. Do not sleep on upholstered furniture (eg, couches).   5. If possible removing carpeting, upholstered furniture and drapery from the home is ideal.  Horizontal blinds should be eliminated in the rooms where the person spends the most time (bedroom, study, television room).  Washable vinyl, roller-type shades are optimal. 6. Remove all non-washable stuffed toys from the bedroom.  Wash stuffed toys weekly like sheets and blankets above.   7. Reduce indoor humidity to less than 50%.  Inexpensive humidity monitors can be purchased at most hardware stores.  Do not use a humidifier as can make the problem worse and are not recommended.  Allergy Shots   Allergies are the result of a chain reaction that starts in the immune system. Your immune system controls how your  body defends itself. For instance, if you have an allergy to pollen, your immune system identifies pollen as an invader or allergen. Your immune system overreacts by producing antibodies called Immunoglobulin E (IgE). These antibodies travel to cells that release chemicals, causing an allergic reaction.  The concept behind allergy immunotherapy, whether it is received in the form of shots or tablets, is that the immune system can be desensitized to specific allergens that trigger allergy symptoms. Although it requires time and patience, the payback can be long-term relief.  How Do Allergy Shots Work?  Allergy shots work much like a vaccine. Your body responds to injected amounts of a particular allergen given in increasing doses, eventually developing a resistance and tolerance to it. Allergy shots can lead to decreased, minimal or no allergy symptoms.  There generally are two phases: build-up and maintenance. Build-up often ranges  from three to six months and involves receiving injections with increasing amounts of the allergens. The shots are typically given once or twice a week, though more rapid build-up schedules are sometimes used.  The maintenance phase begins when the most effective dose is reached. This dose is different for each person, depending on how allergic you are and your response to the build-up injections. Once the maintenance dose is reached, there are longer periods between injections, typically two to four weeks.  Occasionally doctors give cortisone-type shots that can temporarily reduce allergy symptoms. These types of shots are different and should not be confused with allergy immunotherapy shots.  Who Can Be Treated with Allergy Shots?  Allergy shots may be a good treatment approach for people with allergic rhinitis (hay fever), allergic asthma, conjunctivitis (eye allergy) or stinging insect allergy.   Before deciding to begin allergy shots, you should consider:  . The  length of allergy season and the severity of your symptoms . Whether medications and/or changes to your environment can control your symptoms . Your desire to avoid long-term medication use . Time: allergy immunotherapy requires a major time commitment . Cost: may vary depending on your insurance coverage  Allergy shots for children age 60five and older are effective and often well tolerated. They might prevent the onset of new allergen sensitivities or the progression to asthma.  Allergy shots are not started on patients who are pregnant but can be continued on patients who become pregnant while receiving them. In some patients with other medical conditions or who take certain common medications, allergy shots may be of risk. It is important to mention other medications you talk to your allergist.   When Will I Feel Better?  Some may experience decreased allergy symptoms during the build-up phase. For others, it may take as long as 12 months on the maintenance dose. If there is no improvement after a year of maintenance, your allergist will discuss other treatment options with you.  If you aren't responding to allergy shots, it may be because there is not enough dose of the allergen in your vaccine or there are missing allergens that were not identified during your allergy testing. Other reasons could be that there are high levels of the allergen in your environment or major exposure to non-allergic triggers like tobacco smoke.  What Is the Length of Treatment?  Once the maintenance dose is reached, allergy shots are generally continued for three to five years. The decision to stop should be discussed with your allergist at that time. Some people may experience a permanent reduction of allergy symptoms. Others may relapse and a longer course of allergy shots can be considered.  What Are the Possible Reactions?  The two types of adverse reactions that can occur with allergy shots are local and  systemic. Common local reactions include very mild redness and swelling at the injection site, which can happen immediately or several hours after. A systemic reaction, which is less common, affects the entire body or a particular body system. They are usually mild and typically respond quickly to medications. Signs include increased allergy symptoms such as sneezing, a stuffy nose or hives.  Rarely, a serious systemic reaction called anaphylaxis can develop. Symptoms include swelling in the throat, wheezing, a feeling of tightness in the chest, nausea or dizziness. Most serious systemic reactions develop within 30 minutes of allergy shots. This is why it is strongly recommended you wait in your doctor's office for 30 minutes after your injections.  Your allergist is trained to watch for reactions, and his or her staff is trained and equipped with the proper medications to identify and treat them.  Who Should Administer Allergy Shots?  The preferred location for receiving shots is your prescribing allergist's office. Injections can sometimes be given at another facility where the physician and staff are trained to recognize and treat reactions, and have received instructions by your prescribing allergist.

## 2020-10-29 NOTE — Progress Notes (Signed)
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 Mild persistent asthma, uncomplicated - Lung testing not done today. - We will do that next time.  - Spacer sample and demonstration provided. - Daily controller medication(s): Flovent 1 puff twice daily with spacer and Singulair (montelukast) 10 mg daily  - Prior to physical activity: albuterol 2 puffs 10-15 minutes before physical activity. - Rescue medications: albuterol 4 puffs every 4-6 hours as needed - Asthma control goals:  * Full participation in all desired activities (may need albuterol before activity) * Albuterol use two time or less a week on average (not counting use with activity) * Cough interfering with sleep two time or less a month * Oral steroids no more than once a year * No hospitalizations  2. Seasonal and perennial allergic rhinitis - Testing today showed: grasses, ragweed, weeds, trees, indoor molds, outdoor molds, dust mites, cat, dog and cockroach - Copy of test results provided.  - Avoidance measures provided. - Stop taking: Xyzal - Continue with: Singulair (montelukast) 10mg  daily and Flonase (fluticasone) one spray per nostril daily - Start taking: Ryvent (carbinoxamine) 6mg  tablet 3-4 times daily as needed - You can use an extra dose of the antihistamine, if needed, for breakthrough symptoms.  - Consider nasal saline rinses 1-2 times daily to remove allergens from the nasal cavities as well as help with mucous clearance (this is especially helpful to do before the nasal sprays are given) - Consider allergy  shots as a means of long-term control. - Allergy shots "re-train" and "reset" the immune system to ignore environmental allergens and decrease the resulting immune response to those allergens (sneezing, itchy watery eyes, runny nose, nasal congestion, etc).    - Allergy shots improve symptoms in 75-85% of patients.  - Talk to your insurance company about allergy shots and call when you make a decision.  3. Adverse food reaction - Testing was negative to all of the foods. - I anticipate that this reaction to the sauces is likely related to a non-allergy mediated reaction seen with milk containing foods.  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 - Anaphylaxis management plan provided. - EpiPen training provided.  5. Return in about 4 weeks (around 11/26/2020).   Subjective:   Kristina Garza is a 45 y.o. female presenting today for evaluation of  Chief Complaint  Patient presents with  . Asthma  . Allergy Testing    Kristina Garza has a history of the following: Patient Active Problem List   Diagnosis Date Noted  . Fire any allergy 10/30/2020  . Seasonal and perennial allergic rhinitis 10/30/2020  . Obesity 08/31/2020  . Mild persistent asthma, uncomplicated 07/28/2020    History obtained from: chart review and patient.  Kristina Garza was referred by 07/30/2020, PA.     Kristina Garza is a 45 y.o. female presenting for an evaluation of possible allergic reactions. She moved her a few years ago before COVID19. She grew up in [place] and lived in [place] for 20 years. She moved to [place] when she got married to her current husband.    Asthma/Respiratory  Symptom History: She is on asthma medications that were started in 4-5 years. she was off of her medications for the beginning of 2020 through 2021. She is on the Flovent two puffs twice daily and montelukast. She restarted her allergy medications in October 2021. Asthma issues resolved and she no longer  needs to use her albuterol inhaler after eating creamy dressings. She had a lot of asthma symptoms when she was working outdoors. The medications have worked very well for her and she has never been tested or seen by an Proofreader.   Allergic Rhinitis Symptom History: She reports that she has allergies to the outdoors. She is on cetirizine 10mg  daily as well as montelukast 10mg  daily. She noticed being off of the medications, she was doing badly outdoors without the asthma medications on board. She does have animals in her home and she is allergic to them. Overall symptoms are managed well when she is on the medications.   Food Allergy Symptom History: She reports that when she ate "creamy" thinks like ranch dressing, tuna salad, anything with mayonaise, she would developed mucous production as well as coughing. She did not have sneezing, itching, or stomach pain. She did have a barium swallow that was negative. Other than creamy items that contains mayonnaise, she tolerates foods fairly well. She tolerates peanuts, tree nuts, wheat, eggs, seafood, and soy without any problems at all. She might have some itching with soy sauce. She has noticed that the antihistamines "cover up" a lot of these reactions.   Stinging Insect Symptom History: She as stung by fire ants at one point. She developed swelling of her entire body. She did not have to go to the ED and did not need to get epinephrine. She received Benadryl with improvement in her symptoms.   Otherwise, there is no history of other atopic diseases, including drug allergies, stinging insect allergies, eczema, urticaria or contact dermatitis. There is no significant infectious history. Vaccinations are up to date.    Past Medical History: Patient Active Problem List   Diagnosis Date Noted  . Fire any allergy 10/30/2020  . Seasonal and perennial allergic rhinitis 10/30/2020  . Obesity 08/31/2020  . Mild persistent asthma, uncomplicated 07/28/2020     Medication List:  Allergies as of 10/29/2020      Reactions   Pollen Extract Cough, Shortness Of Breath      Medication List       Accurate as of October 29, 2020 11:59 PM. If you have any questions, ask your nurse or doctor.        albuterol 108 (90 Base) MCG/ACT inhaler Commonly known as: ProAir HFA Inhale 2 puffs into the lungs every 6 (six) hours as needed for wheezing or shortness of breath.   EPINEPHrine 0.3 mg/0.3 mL Soaj injection Commonly known as: EPI-PEN Inject 0.3 mg into the muscle once for 1 dose. Started by: 10/31/2020, MD   Flovent HFA 110 MCG/ACT inhaler Generic drug: fluticasone Inhale 1 puff into the lungs in the morning and at bedtime.   levocetirizine 5 MG tablet Commonly known as: XYZAL Take 5 mg by mouth every evening.   montelukast 10 MG tablet Commonly known as: SINGULAIR Take 1 tablet (10 mg total) by mouth at bedtime.   triamcinolone 0.1 % Commonly known as: KENALOG Apply to affected area 1-2 times daily       Birth History: non-contributory  Developmental History: non-contributory  Past Surgical History: Past Surgical History:  Procedure Laterality Date  .  FOOT SURGERY Left 2007   5th Metatarsal broken pin placed  . Knee surgery Right 2016   miniscus removed     Family History: Family History  Problem Relation Age of Onset  . Asthma Mother   . Osteoarthritis Mother   . Hyperlipidemia Father   . Hypertension Father   . Heart attack Father   . Sleep apnea Father   . Hyperlipidemia Paternal Grandmother   . Hypertension Paternal Grandmother   . Heart attack Paternal Grandmother   . Breast cancer Neg Hx   . Colon cancer Neg Hx   . Stomach cancer Neg Hx   . Esophageal cancer Neg Hx   . Pancreatic cancer Neg Hx      Social History: Clydie BraunKaren lives at home with her husband. 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 She used to live in [place] but has moved here right before  the pandemic struck.   Review of Systems  Constitutional: Negative.  Negative for chills, fever, malaise/fatigue and weight loss.  HENT: Positive for congestion and sinus pain. Negative for ear discharge and ear pain.   Eyes: Negative for pain, discharge and redness.  Respiratory: Negative for cough, sputum production, shortness of breath and wheezing.   Cardiovascular: Negative.  Negative for chest pain and palpitations.  Gastrointestinal: Negative for abdominal pain, constipation, diarrhea, heartburn, nausea and vomiting.  Skin: Negative.  Negative for itching and rash.  Neurological: Negative for dizziness and headaches.  Endo/Heme/Allergies: Negative for environmental allergies. Does not bruise/bleed easily.       Objective:   Blood pressure 138/80, pulse 78, temperature 97.9 F (36.6 C), temperature source Temporal, resp. rate 18, height 5' 6.54" (1.69 m), weight 269 lb (122 kg), SpO2 97 %. Body mass index is 42.72 kg/m.   Physical Exam:   Physical Exam Constitutional:      Appearance: She is well-developed.  HENT:     Head: Normocephalic and atraumatic.     Right Ear: Tympanic membrane, ear canal and external ear normal. No drainage, swelling or tenderness. Tympanic membrane is not injected, scarred, erythematous, retracted or bulging.     Left Ear: Tympanic membrane, ear canal and external ear normal. No drainage, swelling or tenderness. Tympanic membrane is not injected, scarred, erythematous, retracted or bulging.     Nose: No nasal deformity, septal deviation, mucosal edema, rhinorrhea or epistaxis.     Right Turbinates: Enlarged, swollen and pale.     Left Turbinates: Enlarged, swollen and pale.     Right Sinus: No maxillary sinus tenderness or frontal sinus tenderness.     Left Sinus: No maxillary sinus tenderness or frontal sinus tenderness.     Comments: No polyps.     Mouth/Throat:     Mouth: Oropharynx is clear and moist. Mucous membranes are not pale and not  dry.     Pharynx: Uvula midline.     Comments: No cobblestoning present at all.  Eyes:     General: Allergic shiner present.        Right eye: No discharge.        Left eye: No discharge.     Extraocular Movements: EOM normal.     Conjunctiva/sclera: Conjunctivae normal.     Right eye: Right conjunctiva is not injected. No chemosis.    Left eye: Left conjunctiva is not injected. No chemosis.    Pupils: Pupils are equal, round, and reactive to light.  Cardiovascular:     Rate and Rhythm: Normal rate and  regular rhythm.     Heart sounds: Normal heart sounds.  Pulmonary:     Effort: Pulmonary effort is normal. No tachypnea, accessory muscle usage or respiratory distress.     Breath sounds: Normal breath sounds. No wheezing, rhonchi or rales.  Chest:     Chest wall: No tenderness.  Abdominal:     Tenderness: There is no abdominal tenderness. There is no guarding or rebound.  Lymphadenopathy:     Head:     Right side of head: No submandibular, tonsillar or occipital adenopathy.     Left side of head: No submandibular, tonsillar or occipital adenopathy.     Cervical: No cervical adenopathy.  Skin:    Coloration: Skin is not pale.     Findings: No abrasion, erythema, petechiae or rash. Rash is not papular, urticarial or vesicular.     Comments: No eczematous or urticarial lesions noted.   Neurological:     Mental Status: She is alert.  Psychiatric:        Mood and Affect: Mood and affect normal.        Behavior: Behavior is cooperative.      Diagnostic studies:   Allergy Studies:     Airborne Adult Perc - 10/29/20 1444    Time Antigen Placed 1445    Allergen Manufacturer Waynette Buttery    Location Back    Number of Test 59    1. Control-Buffer 50% Glycerol Negative    2. Control-Histamine 1 mg/ml 2+    3. Albumin saline Negative    4. Bahia Negative    5. [place] 3+    6. Johnson Negative    7. Kentucky Blue Negative    8. Meadow Fescue Negative    9. Perennial Rye 2+    10.  Sweet Vernal Negative    11. Timothy Negative    12. Cocklebur 2+    13. Burweed Marshelder Negative    14. Ragweed, short Negative    15. Ragweed, Giant 2+    16. Plantain,  English 2+    17. Lamb's Quarters 2+    18. Sheep Sorrell Negative    19. Rough Pigweed Negative    20. Marsh Elder, Rough Negative    21. Mugwort, Common Negative    22. Ash mix Negative    23. Birch mix Negative    24. Beech American 2+    25. Box, Elder Negative    26. Cedar, red Negative    27. Cottonwood, Guinea-Bissau Negative    28. Elm mix Negative    29. Hickory Negative    30. Maple mix 3+    31. Oak, Guinea-[place] mix 3+    32. Pecan Pollen 2+    33. Pine mix 3+    34. Sycamore Eastern 2+    35. Walnut, Black Pollen 2+    36. Alternaria alternata Omitted    37. Cladosporium Herbarum Omitted    38. Aspergillus mix Omitted    39. Penicillium mix Omitted    40. Bipolaris sorokiniana (Helminthosporium) Omitted    41. Drechslera spicifera (Curvularia) Omitted    42. Mucor plumbeus Omitted    43. Fusarium moniliforme Omitted    44. Aureobasidium pullulans (pullulara) Omitted    45. Rhizopus oryzae Omitted    46. Botrytis cinera Omitted    47. Epicoccum nigrum Omitted    48. Phoma betae Omitted    49. Candida Albicans Omitted    50. Trichophyton mentagrophytes Omitted    51. Mite, D Luanne Bras  5,000 AU/ml Omitted    52. Mite, D Pteronyssinus  5,000 AU/ml Omitted    53. Cat Hair 10,000 BAU/ml Omitted    54.  Dog Epithelia Omitted    55. Mixed Feathers Omitted    56. Horse Epithelia Omitted    5557. Cockroach, [place] Omitted    58. Mouse Omitted    59. Tobacco Leaf Omitted          Intradermal - 10/29/20 1531    Time Antigen Placed 1531    Allergen Manufacturer Waynette ButteryGreer    Location Arm    Number of Test 6    Control Negative    Mold 1 Negative    Mold 2 3+    Mold 3 2+    Mold 4 2+    Dog 2+    Cockroach 1+          Food Adult Perc - 10/29/20 1400    Time Antigen Placed 1445    Allergen  Manufacturer Waynette ButteryGreer    Location Back    Number of allergen test 10    1. Peanut Negative    2. Soybean Negative    3. Wheat Negative    5. Milk, cow Negative    6. Egg White, Chicken Negative    8. Shellfish Mix Negative    40. Beef Negative    47. Mushrooms Negative    64. Chocolate/Cacao bean Negative    6. Other 2+           Allergy testing results were read and interpreted by myself, documented by clinical staff.         Malachi BondsJoel Paylin Hailu, MD [facility]

## 2020-10-30 ENCOUNTER — Encounter: Payer: Self-pay | Admitting: Allergy & Immunology

## 2020-10-30 DIAGNOSIS — J302 Other seasonal allergic rhinitis: Secondary | ICD-10-CM | POA: Insufficient documentation

## 2020-10-30 DIAGNOSIS — T63481D Toxic effect of venom of other arthropod, accidental (unintentional), subsequent encounter: Secondary | ICD-10-CM | POA: Insufficient documentation

## 2020-11-05 ENCOUNTER — Other Ambulatory Visit: Payer: Self-pay

## 2020-11-05 MED ORDER — EPINEPHRINE 0.3 MG/0.3ML IJ SOAJ: 0.3000 mg | Freq: Once | INTRAMUSCULAR | 2 refills | Status: AC

## 2020-11-26 ENCOUNTER — Other Ambulatory Visit: Payer: Self-pay

## 2020-11-26 ENCOUNTER — Ambulatory Visit (INDEPENDENT_AMBULATORY_CARE_PROVIDER_SITE_OTHER): Payer: BC Managed Care – PPO | Admitting: Allergy & Immunology

## 2020-11-26 ENCOUNTER — Encounter: Payer: Self-pay | Admitting: Allergy & Immunology

## 2020-11-26 VITALS — BP 120/78 | HR 71 | Temp 97.5°F | Resp 18 | Ht 66.0 in

## 2020-11-26 DIAGNOSIS — J302 Other seasonal allergic rhinitis: Secondary | ICD-10-CM | POA: Diagnosis not present

## 2020-11-26 DIAGNOSIS — T63481D Toxic effect of venom of other arthropod, accidental (unintentional), subsequent encounter: Secondary | ICD-10-CM | POA: Diagnosis not present

## 2020-11-26 DIAGNOSIS — J4530 Mild persistent asthma, uncomplicated: Secondary | ICD-10-CM | POA: Diagnosis not present

## 2020-11-26 DIAGNOSIS — J3089 Other allergic rhinitis: Secondary | ICD-10-CM

## 2020-11-26 MED ORDER — ALVESCO 160 MCG/ACT IN AERS: 1.0000 | INHALATION_SPRAY | Freq: Two times a day (BID) | RESPIRATORY_TRACT | 5 refills | Status: DC

## 2020-11-26 MED ORDER — ALBUTEROL SULFATE (2.5 MG/3ML) 0.083% IN NEBU: 2.5000 mg | INHALATION_SOLUTION | RESPIRATORY_TRACT | 1 refills | Status: DC | PRN

## 2020-11-26 NOTE — Progress Notes (Signed)
FOLLOW UP  Date of Service/Encounter:  11/26/20   Assessment:   Mild persistent asthma, uncomplicated  Seasonal and perennial allergic rhinitis (grasses, ragweed, weeds, trees, indoor molds, outdoor molds, dust mites, cat, dog and cockroach)  Fire ant anaphylaxis - EpiPen up to date   Plan/Recommendations:   1. Mild persistent asthma, uncomplicated - Lung testing  - Stop the Flovent temporality and start Alvesco 160mg  one puff once daily (there is a copy card).  - Daily controller medication(s): ALVESCO 1 puff twice daily with spacer and Singulair (montelukast) 10 mg daily  - Prior to physical activity: albuterol 2 puffs 10-15 minutes before physical activity. - Rescue medications: albuterol 4 puffs every 4-6 hours as needed - Asthma control goals:  * Full participation in all desired activities (may need albuterol before activity) * Albuterol use two time or less a week on average (not counting use with activity) * Cough interfering with sleep two time or less a month * Oral steroids no more than once a year * No hospitalizations  2. Seasonal and perennial allergic rhinitis (grasses, ragweed, weeds, trees, indoor molds, outdoor molds, dust mites, cat, dog and cockroach) - OK to stop the Ryvent since it cause such sleepiness.  - Continue with: Singulair (montelukast) 10mg  daily and Flonase (fluticasone) one spray per nostril daily and the Xyzal 5mg  (can use twice daily) - Consider allergy shots in the future.   3. Fire ant anaphylaxis  - Testing to fire any venom was positive. - Anaphylaxis management plan provided. - EpiPen training provided.  4. Return in about 6 months (around 05/26/2021).  Subjective:   Kristina Garza is a 45 y.o. female presenting today for follow up of  Chief Complaint  Patient presents with  . Asthma    Kristina Garza has a history of the following: Patient Active Problem List   Diagnosis Date Noted  . Fire any allergy 10/30/2020  . Seasonal  and perennial allergic rhinitis 10/30/2020  . Obesity 08/31/2020  . Mild persistent asthma, uncomplicated 07/28/2020    History obtained from: chart review and patient.  Kristina Garza is a 45 y.o. female presenting for a follow up visit.  Asthma/Respiratory Symptom History: She is doing fairly well on the current regimen. She does need a spiro today since ours was not working at the last visit.07/30/2020 asthma has been well controlled. She has not required rescue medication, experienced nocturnal awakenings due to lower respiratory symptoms, nor have activities of daily living been limited. She has required no Emergency Department or Urgent Care visits for her asthma. She has required zero courses of systemic steroids for asthma exacerbations since the last visit. ACT score today is 22, indicating excellent asthma symptom control.   Allergic Rhinitis Symptom History: Overall she remains the same from an allergic rhinitis perspective. She had severe fatigue from the Ryvent. She went back to Xyzal which is managing the symptoms. She has been on the Singulair for a few years now and she does feel worse without it on board.  She is open to allergy shots.  She has a high deductible plan and her insurance covers part of the cost.  She would like to know more specific cost.  She is wondering if we have a billing person available for that.  Fire Kristina Garza Anaphylaxis: She has had testing since last visit.  She does have an up-to-date EpiPen.  She is going to be busy placing her [place] this weekend.  She has plans to plant onions as  well as some broccoli and lettuce.  She stays busy with 3 acres of daily during the summer. 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   Otherwise, there have been no changes to her past medical history, surgical history, family history, or social history.    Review of Systems  Constitutional: Negative.  Negative for chills, fever, malaise/fatigue and weight loss.  HENT: Positive for  congestion and sinus pain. Negative for ear discharge and ear pain.        Positive for postnasal drip.  Eyes: Negative for pain, discharge and redness.  Respiratory: Negative for cough, sputum production, shortness of breath and wheezing.   Cardiovascular: Negative.  Negative for chest pain and palpitations.  Gastrointestinal: Negative for abdominal pain, constipation, diarrhea, heartburn, nausea and vomiting.  Skin: Negative.  Negative for itching and rash.  Neurological: Negative for dizziness and headaches.  Endo/Heme/Allergies: Positive for environmental allergies. Does not bruise/bleed easily.       Objective:   Blood pressure 120/78, pulse 71, temperature (!) 97.5 F (36.4 C), temperature source Temporal, resp. rate 18, height 5\' 6"  (1.676 m), SpO2 97 %. Body mass index is 43.42 kg/m.   Physical Exam:  Physical Exam Constitutional:      Appearance: She is well-developed.     Comments: Wearing a lovely blue outfit.  HENT:     Head: Normocephalic and atraumatic.     Right Ear: Tympanic membrane, ear canal and external ear normal.     Left Ear: Tympanic membrane, ear canal and external ear normal.     Nose: No nasal deformity, septal deviation, mucosal edema, rhinorrhea or epistaxis.     Right Turbinates: Enlarged and swollen.     Left Turbinates: Enlarged and swollen.     Right Sinus: No maxillary sinus tenderness or frontal sinus tenderness.     Left Sinus: No maxillary sinus tenderness or frontal sinus tenderness.     Mouth/Throat:     Mouth: Oropharynx is clear and moist. Mucous membranes are not pale and not dry.     Pharynx: Uvula midline.  Eyes:     General: Allergic shiner present.        Right eye: No discharge.        Left eye: No discharge.     Extraocular Movements: EOM normal.     Conjunctiva/sclera: Conjunctivae normal.     Right eye: Right conjunctiva is not injected. No chemosis.    Left eye: Left conjunctiva is not injected. No chemosis.    Pupils:  Pupils are equal, round, and reactive to light.  Cardiovascular:     Rate and Rhythm: Normal rate and regular rhythm.     Heart sounds: Normal heart sounds.  Pulmonary:     Effort: Pulmonary effort is normal. No tachypnea, accessory muscle usage or respiratory distress.     Breath sounds: Normal breath sounds. No wheezing, rhonchi or rales.     Comments: Moving air well in all lung fields.  No increased work of breathing. Chest:     Chest wall: No tenderness.  Lymphadenopathy:     Cervical: No cervical adenopathy.  Skin:    General: Skin is warm.     Capillary Refill: Capillary refill takes less than 2 seconds.     Coloration: Skin is not pale.     Findings: No abrasion, erythema, petechiae or rash. Rash is not papular, urticarial or vesicular.     Comments: No eczematous or urticarial lesions noted.  Neurological:  Mental Status: She is alert.  Psychiatric:        Mood and Affect: Mood and affect normal.        Behavior: Behavior is cooperative.      Diagnostic studies:    Spirometry: results normal (FEV1: 2.71/86%, FVC: 3.09/79%, FEV1/FVC: 88%).    Spirometry consistent with normal pattern.   Allergy Studies: none          Malachi Bonds, MD  [facility]

## 2020-11-26 NOTE — Patient Instructions (Addendum)
1. Mild persistent asthma, uncomplicated - Lung testing  - Stop the Flovent temporality and start Alvesco 160mg  one puff once daily (there is a copy card).  - Daily controller medication(s): ALVESCO 1 puff twice daily with spacer and Singulair (montelukast) 10 mg daily  - Prior to physical activity: albuterol 2 puffs 10-15 minutes before physical activity. - Rescue medications: albuterol 4 puffs every 4-6 hours as needed - Asthma control goals:  * Full participation in all desired activities (may need albuterol before activity) * Albuterol use two time or less a week on average (not counting use with activity) * Cough interfering with sleep two time or less a month * Oral steroids no more than once a year * No hospitalizations  2. Seasonal and perennial allergic rhinitis (grasses, ragweed, weeds, trees, indoor molds, outdoor molds, dust mites, cat, dog and cockroach) - Testing today showed: grasses, ragweed, weeds, trees, indoor molds, outdoor molds, dust mites, cat, dog and cockroach - OK to stop the Ryvent since it cause such sleepiness.  - Continue with: Singulair (montelukast) 10mg  daily and Flonase (fluticasone) one spray per nostril daily and the Xyzal 5mg  (can use twice daily) - Consider allergy shots in the future.   3. Fire ant anaphylaxis  - Testing to fire any venom was positive. - Anaphylaxis management plan provided. - EpiPen training provided.  4. Return in about 6 months (around 05/26/2021).   Please inform of any Emergency Department visits, hospitalizations, or changes in symptoms. Call before going to the ED for breathing or allergy symptoms since we might be able to fit you in for a sick visit. Feel free to contact 05/28/2021 anytime with any questions, problems, or concerns.  It was a pleasure to see you again today!  Websites that have reliable patient information: 1. American Academy of Asthma, Allergy, and Immunology: www.aaaai.org 2. Food Allergy Research and  Education (FARE): foodallergy.org 3. Mothers of Asthmatics: http://www.asthmacommunitynetwork.org 4. American College of Allergy, Asthma, and Immunology: www.acaai.org   COVID-19 Vaccine Information can be found at: [place] For questions related to vaccine distribution or appointments, please email [email or web address] or call [phone].   We realize that you might be concerned about having an allergic reaction to the COVID19 vaccines. To help with that concern, WE ARE OFFERING THE COVID19 VACCINES IN OUR OFFICE! Ask the front desk for dates!     "Like" [place] on Facebook and Instagram for our latest updates!      A healthy democracy works best when [email or web address] participate! Make sure you are registered to vote! If you have moved or changed any of your contact information, you will need to get this updated before voting!  In some cases, you MAY be able to register to vote online: [phone]

## 2020-11-29 ENCOUNTER — Encounter: Payer: Self-pay | Admitting: Allergy & Immunology

## 2021-01-18 ENCOUNTER — Encounter: Payer: Self-pay | Admitting: Physician Assistant

## 2021-01-18 NOTE — Progress Notes (Signed)
51

## 2021-02-21 ENCOUNTER — Other Ambulatory Visit: Payer: Self-pay | Admitting: Physician Assistant

## 2021-05-27 ENCOUNTER — Ambulatory Visit (INDEPENDENT_AMBULATORY_CARE_PROVIDER_SITE_OTHER): Payer: BC Managed Care – PPO | Admitting: Allergy & Immunology

## 2021-05-27 ENCOUNTER — Encounter: Payer: Self-pay | Admitting: Allergy & Immunology

## 2021-05-27 ENCOUNTER — Other Ambulatory Visit: Payer: Self-pay

## 2021-05-27 VITALS — BP 122/90 | HR 81 | Temp 95.5°F | Resp 18 | Ht 66.0 in | Wt 268.4 lb

## 2021-05-27 DIAGNOSIS — J302 Other seasonal allergic rhinitis: Secondary | ICD-10-CM

## 2021-05-27 DIAGNOSIS — J4530 Mild persistent asthma, uncomplicated: Secondary | ICD-10-CM

## 2021-05-27 DIAGNOSIS — J3089 Other allergic rhinitis: Secondary | ICD-10-CM | POA: Diagnosis not present

## 2021-05-27 DIAGNOSIS — T63481D Toxic effect of venom of other arthropod, accidental (unintentional), subsequent encounter: Secondary | ICD-10-CM

## 2021-05-27 MED ORDER — FLUTICASONE PROPIONATE HFA 110 MCG/ACT IN AERO: 1.0000 | INHALATION_SPRAY | Freq: Two times a day (BID) | RESPIRATORY_TRACT | 11 refills | Status: DC

## 2021-05-27 MED ORDER — LEVOCETIRIZINE DIHYDROCHLORIDE 5 MG PO TABS: 5.0000 mg | ORAL_TABLET | Freq: Every evening | ORAL | 3 refills | Status: DC

## 2021-05-27 NOTE — Progress Notes (Signed)
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 Mild persistent asthma, uncomplicated - Lung testing looked amazing today.  - We are not going to make any changes at this time.  - Daily controller medication(s): FLOVENT 1 puff twice daily with spacer and Singulair (montelukast) 10 mg daily  - Prior to physical activity: albuterol 2 puffs 10-15 minutes before physical activity. - Rescue medications: albuterol 4 puffs every 4-6 hours as needed - Asthma control goals:  * Full participation in all desired activities (may need albuterol before activity) * Albuterol use two time or less a week on average (not counting use with activity) * Cough interfering with sleep two time or less a month * Oral steroids no more than once a year * No hospitalizations  2. Seasonal and perennial allergic rhinitis (grasses, ragweed, weeds, trees, indoor molds, outdoor molds, dust mites, cat, dog and cockroach) - Continue with: Singulair (montelukast) 10mg  daily and Flonase (fluticasone) one spray per nostril daily and the Xyzal 5mg  (can use twice daily) - Allergy shot consent signed today. - Make an appointment in 2-3 weeks to start shots.   3. 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   4. Poison ivy - Try using Tecnu to remove the oil after a known exposure. - Add on Eucrisa twice daily as needed for itching to stop the spreading of the rash.   5. Return in about 6 months (around 11/27/2021).   Subjective:   Kristina Garza is a 45 y.o. female presenting today for follow up of  Chief Complaint  Patient presents with   Asthma    Kristina Garza has a history of the following: Patient Active Problem List    Diagnosis Date Noted   Fire any allergy 10/30/2020   Seasonal and perennial allergic rhinitis 10/30/2020   Obesity 08/31/2020   Mild persistent asthma, uncomplicated 07/28/2020    History obtained from: chart review and patient.  Kristina Garza is a 45 y.o. female presenting for a follow up visit.  She was last seen in February 2022.  At that time, we stopped the Flovent and started Alvesco 1 puff once daily to make it more affordable for her.  Was continued with albuterol as needed.  For her seasonal and perennial allergic rhinitis, we stopped the RyVent since it was causing sleepiness.  We continue with Singulair and Flonase as well as Xyzal.  We did testing to fire ant venom which was positive.  We have provided her with an EpiPen and an anaphylaxis management plan.  Since last visit, she has done well.   Asthma/Respiratory Symptom History: She is on the Flovent one puff twice daily.  She is able to do more without her SOB episodes. She has not been using her albuterol. She estimates that she has only been using it once per week. She thinks that she uses it once per week or so. It seems to be more related to environmental triggers like when she is outside mowing her lawn. She has a lot of animals and is allergic to all of them. She does not think that she used it at all last week. She notices that she can be exposed to dairy which thickens her mucous  and causes more coughing. Now she is trying to avoid them.   Allergic Rhinitis Symptom History: She remains on the Xyzal. She notices a difference when she runs out of the Xyzal. The Zyrtec does not seem to be as effective.  She remains on the Singulair and the Flonase.  She did check on allergy shots and would like to pursue this.  She has not needed antibiotics at all.  She has had poison ivy three times.  She never really knows when she is exposed to it.  She spends a lot of time outdoors.  She is wondering if there is anything she can use to prevent the  spreading of it.  Otherwise, there have been no changes to her past medical history, surgical history, family history, or social history.    Review of Systems  Constitutional: Negative.  Negative for fever, malaise/fatigue and weight loss.  HENT:  Positive for congestion. Negative for ear discharge and ear pain.   Eyes:  Negative for pain, discharge and redness.  Respiratory:  Negative for cough, sputum production, shortness of breath and wheezing.   Cardiovascular: Negative.  Negative for chest pain and palpitations.  Gastrointestinal:  Negative for abdominal pain, constipation, diarrhea, heartburn, nausea and vomiting.  Skin: Negative.  Negative for itching and rash.  Neurological:  Negative for dizziness and headaches.  Endo/Heme/Allergies:  Positive for environmental allergies. Does not bruise/bleed easily.      Objective:   Blood pressure 122/90, pulse 81, temperature (!) 95.5 F (35.3 C), temperature source Temporal, resp. rate 18, height 5\' 6"  (1.676 m), weight 268 lb 6.4 oz (121.7 kg), SpO2 97 %. Body mass index is 43.32 kg/m.   Physical Exam:  Physical Exam Vitals reviewed.  Constitutional:      Appearance: She is well-developed.     Comments: Pleasant and talkative.  HENT:     Head: Normocephalic and atraumatic.     Right Ear: Tympanic membrane, ear canal and external ear normal.     Left Ear: Tympanic membrane, ear canal and external ear normal.     Nose: No nasal deformity, septal deviation, mucosal edema or rhinorrhea.     Right Turbinates: Enlarged and swollen.     Left Turbinates: Enlarged and swollen.     Right Sinus: No maxillary sinus tenderness or frontal sinus tenderness.     Left Sinus: No maxillary sinus tenderness or frontal sinus tenderness.     Mouth/Throat:     Mouth: Mucous membranes are not pale and not dry.     Pharynx: Uvula midline.  Eyes:     General: Lids are normal. No allergic shiner.       Right eye: No discharge.        Left eye:  No discharge.     Conjunctiva/sclera: Conjunctivae normal.     Right eye: Right conjunctiva is not injected. No chemosis.    Left eye: Left conjunctiva is not injected. No chemosis.    Pupils: Pupils are equal, round, and reactive to light.  Cardiovascular:     Rate and Rhythm: Normal rate and regular rhythm.     Heart sounds: Normal heart sounds.  Pulmonary:     Effort: Pulmonary effort is normal. No tachypnea, accessory muscle usage or respiratory distress.     Breath sounds: Normal breath sounds. No wheezing, rhonchi or rales.     Comments: Moving air well in all lung fields.  No increased work of breathing. Chest:     Chest wall: No tenderness.  Lymphadenopathy:     Cervical: No cervical adenopathy.  Skin:    General: Skin is warm.     Capillary Refill: Capillary refill takes less than 2 seconds.     Coloration: Skin is not pale.     Findings: No abrasion, erythema, petechiae or rash. Rash is not papular, urticarial or vesicular.     Comments: No eczematous or urticarial lesions noted.    Neurological:     Mental Status: She is alert.  Psychiatric:        Behavior: Behavior is cooperative.     Diagnostic studies:    Spirometry: results normal (FEV1: 2.39/78%, FVC: 2.77/72%, FEV1/FVC: 86%).    Spirometry consistent with normal pattern.   Allergy Studies: none        Malachi Bonds, MD  [facility]

## 2021-05-27 NOTE — Patient Instructions (Addendum)
1. Mild persistent asthma, uncomplicated - Lung testing looked amazing today.  - We are not going to make any changes at this time.  - Daily controller medication(s): FLOVENT 1 puff twice daily with spacer and Singulair (montelukast) 10 mg daily  - Prior to physical activity: albuterol 2 puffs 10-15 minutes before physical activity. - Rescue medications: albuterol 4 puffs every 4-6 hours as needed - Asthma control goals:  * Full participation in all desired activities (may need albuterol before activity) * Albuterol use two time or less a week on average (not counting use with activity) * Cough interfering with sleep two time or less a month * Oral steroids no more than once a year * No hospitalizations  2. Seasonal and perennial allergic rhinitis (grasses, ragweed, weeds, trees, indoor molds, outdoor molds, dust mites, cat, dog and cockroach) - Continue with: Singulair (montelukast) 10mg  daily and Flonase (fluticasone) one spray per nostril daily and the Xyzal 5mg  (can use twice daily) - Allergy shot consent signed today. - Make an appointment in 2-3 weeks to start shots.   3. 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   4. Poison ivy - Try using Tecnu to remove the oil after a known exposure. - Add on Eucrisa twice daily as needed for itching to stop the spreading of the rash.   5. Return in about 6 months (around 11/27/2021).   Please inform of any Emergency Department visits, hospitalizations, or changes in symptoms. Call 11/29/2021 before going to the ED for breathing or allergy symptoms since we might be able to fit you in for a sick visit. Feel free to contact us anytime with any questions, problems, or concerns.  It was a pleasure to see you again today!  Websites that have reliable patient information: 1. American Academy of Asthma, Allergy, and Immunology: www.aaaai.org 2. Food Allergy Research and Education (FARE): foodallergy.org 3. Mothers of  Asthmatics: http://www.asthmacommunitynetwork.org 4. American College of Allergy, Asthma, and Immunology: www.acaai.org   COVID-19 Vaccine Information can be found at: [place] For questions related to vaccine distribution or appointments, please email [email or web address] or call [phone].   We realize that you might be concerned about having an allergic reaction to the COVID19 vaccines. To help with that concern, WE ARE OFFERING THE COVID19 VACCINES IN OUR OFFICE! Ask the front desk for dates!     "Like" [email or web address] on Facebook and Instagram for our latest updates!      A healthy democracy works best when [phone] participate! Make sure you are registered to vote! If you have moved or changed any of your contact information, you will need to get this updated before voting!  In some cases, you MAY be able to register to vote online: [place]

## 2021-05-30 ENCOUNTER — Other Ambulatory Visit: Payer: Self-pay

## 2021-05-30 ENCOUNTER — Ambulatory Visit (HOSPITAL_COMMUNITY): Payer: BC Managed Care – PPO

## 2021-05-30 ENCOUNTER — Ambulatory Visit
Admission: EM | Admit: 2021-05-30 | Discharge: 2021-05-30 | Disposition: A | Discharge status: Home / Self Care | Payer: BC Managed Care – PPO | Attending: Family Medicine | Admitting: Family Medicine

## 2021-05-30 ENCOUNTER — Encounter: Payer: Self-pay | Admitting: Emergency Medicine

## 2021-05-30 DIAGNOSIS — S0502XA Injury of conjunctiva and corneal abrasion without foreign body, left eye, initial encounter: Secondary | ICD-10-CM

## 2021-05-30 MED ORDER — ERYTHROMYCIN 5 MG/GM OP OINT: TOPICAL_OINTMENT | OPHTHALMIC | 0 refills | Status: DC

## 2021-05-30 NOTE — Progress Notes (Addendum)
VIALS MADE. EXP 06-08-22 

## 2021-05-30 NOTE — Progress Notes (Signed)
Aeroallergen Immunotherapy   Ordering Provider: Dr. Malachi Bonds   Patient Details  Name: Kristina Garza  MRN: [id number]  Date of Birth: 01/12/1976   Order 1 of 2   Vial Label: G/W/T/C/D   0.3 ml (Volume)  BAU Concentration -- 7 Grass Mix* 100,000 ([address] [place], [place], [place], [place], [place], Sweet [place], [place])  0.3 ml (Volume)  BAU Concentration -- [place] 10,000  0.2 ml (Volume)  1:20 Concentration -- Cocklebur  0.5 ml (Volume)  1:20 Concentration -- Weed Mix*  0.5 ml (Volume)  1:20 Concentration -- Eastern 10 Tree Mix (also Sweet Gum)  0.2 ml (Volume)  1:10 Concentration -- Pecan Pollen  0.2 ml (Volume)  1:10 Concentration -- Pine Mix  0.5 ml (Volume)  1:10 Concentration -- Cat Hair  0.5 ml (Volume)  1:10 Concentration -- Dog Epithelia    3.2  ml Extract Subtotal  1.8  ml Diluent  5.0  ml Maintenance Total   Schedule:  A   Blue Vial (1:100,000): Schedule A (10 doses)  Yellow Vial (1:10,000): Schedule A (10 doses)  Green Vial (1:1,000): Schedule A (10 doses)  Red Vial (1:100): Schedule A (10 doses)   Special Instructions: none

## 2021-05-30 NOTE — Progress Notes (Signed)
Aeroallergen Immunotherapy   Ordering Provider: Dr. Malachi Bonds   Patient Details  Name: Kang Ishida  MRN: [id number]  Date of Birth: 1976/08/01   Order 2 of 2   Vial Label: RW/Molds/CR/DM   0.3 ml (Volume)  1:20 Concentration -- Ragweed Mix  0.2 ml (Volume)  1:10 Concentration -- Aspergillus mix  0.2 ml (Volume)  1:10 Concentration -- Penicillium mix  0.2 ml (Volume)  1:20 Concentration -- Bipolaris sorokiniana  0.2 ml (Volume)  1:20 Concentration -- Drechslera spicifera  0.2 ml (Volume)  1:10 Concentration -- Mucor plumbeus  0.2 ml (Volume)  1:10 Concentration -- Fusarium moniliforme  0.2 ml (Volume)  1:40 Concentration -- Aureobasidium pullulans  0.2 ml (Volume)  1:10 Concentration -- Rhizopus oryzae  0.3 ml (Volume)  1:20 Concentration -- Cockroach, [place]  0.4 ml (Volume)   AU Concentration -- Mite Mix (DF 5,000 & DP 5,000)    2.6  ml Extract Subtotal  2.4  ml Diluent  5.0  ml Maintenance Total   Schedule:  A   Blue Vial (1:100,000): Schedule A (10 doses)  Yellow Vial (1:10,000): Schedule A (10 doses)  Green Vial (1:1,000): Schedule A (10 doses)  Red Vial (1:100): Schedule A (10 doses)   Special Instructions: none

## 2021-05-30 NOTE — ED Provider Notes (Signed)
[facility]    CSN: [id number] Arrival date & time: 05/30/21  0911      History   Chief Complaint No chief complaint on file.   HPI Kristina Garza is a 45 y.o. female.   HPI Patient presents with left eye redness, irritation, and persistent tearing following removal of her contact 3 days ago. She suspects scratching her eye. She endorses progressively worsening irritation and drainage. Denies any visual changes associated with eye injury   Past Medical History:  Diagnosis Date   Allergy    Asthma     Patient Active Problem List   Diagnosis Date Noted   Fire any allergy 10/30/2020   Seasonal and perennial allergic rhinitis 10/30/2020   Obesity 08/31/2020   Mild persistent asthma, uncomplicated 07/28/2020    Past Surgical History:  Procedure Laterality Date   FOOT SURGERY Left 2007   5th Metatarsal broken pin placed   Knee surgery Right 2016   miniscus removed    OB History   No obstetric history on file.      Home Medications    Prior to Admission medications   Medication Sig Start Date End Date Taking? Authorizing Provider  erythromycin ophthalmic ointment Place a 1/2 inch ribbon of ointment into the lower eyelid twice daily for 5 days. 05/30/21  Yes Bing Neighbors, FNP  albuterol (PROAIR HFA) 108 (90 Base) MCG/ACT inhaler Inhale 2 puffs into the lungs every 6 (six) hours as needed for wheezing or shortness of breath. 07/28/20   Jarold Motto, PA  albuterol (PROVENTIL) (2.5 MG/3ML) 0.083% nebulizer solution Take 3 mLs (2.5 mg total) by nebulization every 4 (four) hours as needed for wheezing or shortness of breath. 11/26/20   Alfonse Spruce, MD  ciclesonide (ALVESCO) 160 MCG/ACT inhaler Inhale 1 puff into the lungs 2 (two) times daily. 11/26/20   Alfonse Spruce, MD  EPINEPHrine 0.3 mg/0.3 mL IJ SOAJ injection SMARTSIG:0.3 Milliliter(s) IM Once PRN 11/07/20   [provider]  fluticasone (FLOVENT HFA) 110 MCG/ACT inhaler  Inhale 1 puff into the lungs in the morning and at bedtime. 05/27/21   Alfonse Spruce, MD  levocetirizine (XYZAL) 5 MG tablet Take 1 tablet (5 mg total) by mouth every evening. 05/27/21 08/25/21  Alfonse Spruce, MD  montelukast (SINGULAIR) 10 MG tablet TAKE 1 TABLET BY MOUTH AT  BEDTIME 02/22/21   Ardith Dark, MD  triamcinolone cream (KENALOG) 0.1 % Apply to affected area 1-2 times daily 07/28/20   Jarold Motto, PA    Family History Family History  Problem Relation Age of Onset   Asthma Mother    Osteoarthritis Mother    Hyperlipidemia Father    Hypertension Father    Heart attack Father    Sleep apnea Father    Hyperlipidemia Paternal Grandmother    Hypertension Paternal Grandmother    Heart attack Paternal Grandmother    Breast cancer Neg Hx    Colon cancer Neg Hx    Stomach cancer Neg Hx    Esophageal cancer Neg Hx    Pancreatic cancer Neg Hx     Social History Social History   Tobacco Use   Smoking status: Never   Smokeless tobacco: Never  Vaping Use   Vaping Use: Never used  Substance Use Topics   Alcohol use: Yes    Alcohol/week: 0.0 - 2.0 standard drinks   Drug use: Never     Allergies   Pollen extract   Review of Systems Review of Systems  Pertinent negatives listed in HPI   Physical Exam Triage Vital Signs ED Triage Vitals  Enc Vitals Group     BP 05/30/21 0928 (!) 141/85     Pulse Rate 05/30/21 0928 86     Resp 05/30/21 0928 16     Temp 05/30/21 0928 98.8 F (37.1 C)     Temp Source 05/30/21 0928 Oral     SpO2 05/30/21 0928 96 %     Weight --      Height --      Head Circumference --      Peak Flow --      Pain Score 05/30/21 0929 9     Pain Loc --      Pain Edu? --      Excl. in GC? --    No data found.  Updated Vital Signs BP (!) 141/85 (BP Location: Right Arm)   Pulse 86   Temp 98.8 F (37.1 C) (Oral)   Resp 16   LMP 05/04/2021 (Approximate)   SpO2 96%   Visual Acuity Right Eye Distance:   Left Eye  Distance:   Bilateral Distance:    Right Eye Near:   Left Eye Near:    Bilateral Near:     Physical Exam Constitutional:      Appearance: Normal appearance.  HENT:     Head: Normocephalic and atraumatic.  Eyes:     General: Lids are normal.        Right eye: No foreign body, discharge or hordeolum.        Left eye: Discharge present.No foreign body.     Extraocular Movements:     Right eye: Normal extraocular motion.     Left eye: Normal extraocular motion.     Pupils:     Right eye: Pupil is reactive and not sluggish. No corneal abrasion or fluorescein uptake.     Left eye: Corneal abrasion and fluorescein uptake present.  Cardiovascular:     Rate and Rhythm: Normal rate and regular rhythm.  Pulmonary:     Effort: Pulmonary effort is normal.     Breath sounds: Normal breath sounds and air entry.  Skin:    General: Skin is warm.     Capillary Refill: Capillary refill takes less than 2 seconds.  Neurological:     Mental Status: She is alert.  Psychiatric:        Attention and Perception: Attention normal.        Mood and Affect: Mood normal.        Speech: Speech normal.     UC Treatments / Results  Labs (all labs ordered are listed, but only abnormal results are displayed) Labs Reviewed - No data to display  EKG   Radiology No results found.  Procedures Procedures (including critical care time)  Medications Ordered in UC Medications - No data to display  Initial Impression / Assessment and Plan / UC Course  I have reviewed the triage vital signs and the nursing notes.  Pertinent labs & imaging results that were available during my care of the patient were reviewed by me and considered in my medical decision making (see chart for details).    Abrasion of left cornea. Erythromycin BID x 5 days RTC PRN ER or Opthalmology if visual changes occur Final Clinical Impressions(s) / UC Diagnoses   Final diagnoses:  Abrasion of left cornea, initial encounter      Discharge Instructions      Apply erythromycin ointment to  lower left eye lid twice daily for 5 days. Warm compresses for comfort. Avoid contact lenses until treatment has resolved. If pain worsen or you develop any subsequent changes in vision, follow-up with your eye care provider      ED Prescriptions     Medication Sig Dispense Auth. Provider   erythromycin ophthalmic ointment Place a 1/2 inch ribbon of ointment into the lower eyelid twice daily for 5 days. 3.5 g Bing Neighbors, FNP      PDMP not reviewed this encounter.   Bing Neighbors, FNP 06/03/21 0500

## 2021-05-30 NOTE — ED Triage Notes (Signed)
Thinks she scratched her eye on Friday night when taking out contact.  Eye red and watery.

## 2021-05-30 NOTE — Discharge Instructions (Addendum)
Apply erythromycin ointment to lower left eye lid twice daily for 5 days. Warm compresses for comfort. Avoid contact lenses until treatment has resolved. If pain worsen or you develop any subsequent changes in vision, follow-up with your eye care provider

## 2021-06-08 DIAGNOSIS — J3081 Allergic rhinitis due to animal (cat) (dog) hair and dander: Secondary | ICD-10-CM | POA: Diagnosis not present

## 2021-06-09 DIAGNOSIS — J3089 Other allergic rhinitis: Secondary | ICD-10-CM | POA: Diagnosis not present

## 2021-07-06 ENCOUNTER — Other Ambulatory Visit: Payer: Self-pay

## 2021-07-06 ENCOUNTER — Ambulatory Visit (INDEPENDENT_AMBULATORY_CARE_PROVIDER_SITE_OTHER): Payer: BC Managed Care – PPO

## 2021-07-06 DIAGNOSIS — J309 Allergic rhinitis, unspecified: Secondary | ICD-10-CM | POA: Diagnosis not present

## 2021-07-06 NOTE — Progress Notes (Signed)
Immunotherapy   Patient Details  Name: Kristina Garza MRN: [id number] Date of Birth: 1975-12-11  07/06/2021  Otis Peak started injections for  G-W-T-C-D and RW-Molds-CR-DM with an expiration of 06/08/2022. Patient received 0.05 of both her blue vials. Patient waited in office for 30 minutes with no problems. Following schedule: A  Frequency:1 time per week Epi-Pen:Epi-Pen Available  Consent signed and patient instructions given.   Dub Mikes 07/06/2021, 9:44 AM

## 2021-07-13 ENCOUNTER — Ambulatory Visit (INDEPENDENT_AMBULATORY_CARE_PROVIDER_SITE_OTHER): Payer: BC Managed Care – PPO

## 2021-07-13 DIAGNOSIS — J309 Allergic rhinitis, unspecified: Secondary | ICD-10-CM | POA: Diagnosis not present

## 2021-07-22 ENCOUNTER — Ambulatory Visit (INDEPENDENT_AMBULATORY_CARE_PROVIDER_SITE_OTHER): Payer: BC Managed Care – PPO

## 2021-07-22 DIAGNOSIS — J309 Allergic rhinitis, unspecified: Secondary | ICD-10-CM

## 2021-07-29 ENCOUNTER — Ambulatory Visit (INDEPENDENT_AMBULATORY_CARE_PROVIDER_SITE_OTHER): Payer: BC Managed Care – PPO

## 2021-07-29 DIAGNOSIS — J309 Allergic rhinitis, unspecified: Secondary | ICD-10-CM | POA: Diagnosis not present

## 2021-08-03 ENCOUNTER — Ambulatory Visit (INDEPENDENT_AMBULATORY_CARE_PROVIDER_SITE_OTHER): Payer: BC Managed Care – PPO

## 2021-08-03 DIAGNOSIS — J309 Allergic rhinitis, unspecified: Secondary | ICD-10-CM

## 2021-08-12 ENCOUNTER — Ambulatory Visit (INDEPENDENT_AMBULATORY_CARE_PROVIDER_SITE_OTHER): Payer: BC Managed Care – PPO

## 2021-08-12 DIAGNOSIS — J309 Allergic rhinitis, unspecified: Secondary | ICD-10-CM | POA: Diagnosis not present

## 2021-08-24 ENCOUNTER — Ambulatory Visit (INDEPENDENT_AMBULATORY_CARE_PROVIDER_SITE_OTHER): Payer: BC Managed Care – PPO

## 2021-08-24 DIAGNOSIS — J309 Allergic rhinitis, unspecified: Secondary | ICD-10-CM | POA: Diagnosis not present

## 2021-08-26 ENCOUNTER — Telehealth: Payer: Self-pay | Admitting: Allergy & Immunology

## 2021-08-26 NOTE — Telephone Encounter (Signed)
Patient called requesting refill for ProAir inhaler. I did advise patient that ProAir has been taken off the market & an alternative would have to be sent in. Patient stated she had received an inhaler that stated "Albuterol Sulfate Inhalation  - per acluation 200 Metered inhalation Prescription only" Patient stated the container is dark blue/army green color and the canister is silver with green and orange.   I did advise patient I would send a message to Dr. Dellis Anes to see if this is fine or if something else would need to be sent in. Patient verbalized understanding  [facility]  Best contact number: [phone]

## 2021-08-29 NOTE — Telephone Encounter (Signed)
Lm for pt stating that dr gallagher said that the inhaler she picked up would be adequate

## 2021-08-29 NOTE — Telephone Encounter (Signed)
That inhaler should be adequate.  Malachi Bonds, MD [facility]

## 2021-08-30 NOTE — Telephone Encounter (Signed)
Pt states she  is requesting a refill for an inhaler, she was not clear on the message that was left but all she needs is a refill.

## 2021-08-31 ENCOUNTER — Ambulatory Visit (INDEPENDENT_AMBULATORY_CARE_PROVIDER_SITE_OTHER): Payer: BC Managed Care – PPO | Admitting: *Deleted

## 2021-08-31 DIAGNOSIS — J309 Allergic rhinitis, unspecified: Secondary | ICD-10-CM

## 2021-09-05 NOTE — Telephone Encounter (Signed)
Dr. Dellis Anes can you please elaborate which inhale the patient is specifically talking about so we can get this sent in for her?

## 2021-09-07 ENCOUNTER — Other Ambulatory Visit: Payer: Self-pay | Admitting: *Deleted

## 2021-09-07 ENCOUNTER — Ambulatory Visit (INDEPENDENT_AMBULATORY_CARE_PROVIDER_SITE_OTHER): Payer: BC Managed Care – PPO

## 2021-09-07 DIAGNOSIS — J309 Allergic rhinitis, unspecified: Secondary | ICD-10-CM

## 2021-09-07 MED ORDER — ALBUTEROL SULFATE HFA 108 (90 BASE) MCG/ACT IN AERS: 2.0000 | INHALATION_SPRAY | Freq: Four times a day (QID) | RESPIRATORY_TRACT | 1 refills | Status: DC | PRN

## 2021-09-07 NOTE — Telephone Encounter (Signed)
Please disregard. Patient came in and stated which inhaler she needed. Refills have been sent in.

## 2021-09-07 NOTE — Telephone Encounter (Signed)
Perfecto.   Ramata Strothman, MD [facility]  

## 2021-09-14 ENCOUNTER — Ambulatory Visit (INDEPENDENT_AMBULATORY_CARE_PROVIDER_SITE_OTHER): Payer: BC Managed Care – PPO

## 2021-09-14 DIAGNOSIS — J309 Allergic rhinitis, unspecified: Secondary | ICD-10-CM | POA: Diagnosis not present

## 2021-09-21 ENCOUNTER — Ambulatory Visit (INDEPENDENT_AMBULATORY_CARE_PROVIDER_SITE_OTHER): Payer: BC Managed Care – PPO | Admitting: *Deleted

## 2021-09-21 DIAGNOSIS — J309 Allergic rhinitis, unspecified: Secondary | ICD-10-CM | POA: Diagnosis not present

## 2021-09-28 ENCOUNTER — Ambulatory Visit (INDEPENDENT_AMBULATORY_CARE_PROVIDER_SITE_OTHER): Payer: BC Managed Care – PPO

## 2021-09-28 DIAGNOSIS — J309 Allergic rhinitis, unspecified: Secondary | ICD-10-CM | POA: Diagnosis not present

## 2021-10-05 ENCOUNTER — Ambulatory Visit (INDEPENDENT_AMBULATORY_CARE_PROVIDER_SITE_OTHER): Payer: BC Managed Care – PPO | Admitting: *Deleted

## 2021-10-05 DIAGNOSIS — J309 Allergic rhinitis, unspecified: Secondary | ICD-10-CM | POA: Diagnosis not present

## 2021-10-12 ENCOUNTER — Ambulatory Visit (INDEPENDENT_AMBULATORY_CARE_PROVIDER_SITE_OTHER): Payer: BC Managed Care – PPO

## 2021-10-12 DIAGNOSIS — J309 Allergic rhinitis, unspecified: Secondary | ICD-10-CM | POA: Diagnosis not present

## 2021-10-19 ENCOUNTER — Ambulatory Visit (INDEPENDENT_AMBULATORY_CARE_PROVIDER_SITE_OTHER): Payer: BC Managed Care – PPO

## 2021-10-19 DIAGNOSIS — J309 Allergic rhinitis, unspecified: Secondary | ICD-10-CM

## 2021-10-26 ENCOUNTER — Ambulatory Visit (INDEPENDENT_AMBULATORY_CARE_PROVIDER_SITE_OTHER): Payer: BC Managed Care – PPO

## 2021-10-26 DIAGNOSIS — J309 Allergic rhinitis, unspecified: Secondary | ICD-10-CM | POA: Diagnosis not present

## 2021-11-02 ENCOUNTER — Ambulatory Visit (INDEPENDENT_AMBULATORY_CARE_PROVIDER_SITE_OTHER): Payer: BC Managed Care – PPO

## 2021-11-02 DIAGNOSIS — J309 Allergic rhinitis, unspecified: Secondary | ICD-10-CM

## 2021-11-09 ENCOUNTER — Ambulatory Visit (INDEPENDENT_AMBULATORY_CARE_PROVIDER_SITE_OTHER): Payer: BC Managed Care – PPO

## 2021-11-09 DIAGNOSIS — J309 Allergic rhinitis, unspecified: Secondary | ICD-10-CM | POA: Diagnosis not present

## 2021-11-16 ENCOUNTER — Ambulatory Visit (INDEPENDENT_AMBULATORY_CARE_PROVIDER_SITE_OTHER): Payer: BC Managed Care – PPO

## 2021-11-16 DIAGNOSIS — J309 Allergic rhinitis, unspecified: Secondary | ICD-10-CM | POA: Diagnosis not present

## 2021-11-23 ENCOUNTER — Ambulatory Visit (INDEPENDENT_AMBULATORY_CARE_PROVIDER_SITE_OTHER): Payer: BC Managed Care – PPO | Admitting: *Deleted

## 2021-11-23 DIAGNOSIS — J309 Allergic rhinitis, unspecified: Secondary | ICD-10-CM

## 2021-11-30 ENCOUNTER — Ambulatory Visit (INDEPENDENT_AMBULATORY_CARE_PROVIDER_SITE_OTHER): Payer: BC Managed Care – PPO

## 2021-11-30 DIAGNOSIS — J309 Allergic rhinitis, unspecified: Secondary | ICD-10-CM

## 2021-12-09 ENCOUNTER — Ambulatory Visit: Payer: Self-pay

## 2021-12-09 ENCOUNTER — Other Ambulatory Visit: Payer: Self-pay

## 2021-12-09 ENCOUNTER — Ambulatory Visit: Payer: BC Managed Care – PPO | Admitting: Allergy & Immunology

## 2021-12-09 ENCOUNTER — Encounter: Payer: Self-pay | Admitting: Allergy & Immunology

## 2021-12-09 VITALS — BP 128/82 | HR 72

## 2021-12-09 DIAGNOSIS — J3089 Other allergic rhinitis: Secondary | ICD-10-CM

## 2021-12-09 DIAGNOSIS — J4530 Mild persistent asthma, uncomplicated: Secondary | ICD-10-CM | POA: Diagnosis not present

## 2021-12-09 DIAGNOSIS — K219 Gastro-esophageal reflux disease without esophagitis: Secondary | ICD-10-CM | POA: Diagnosis not present

## 2021-12-09 DIAGNOSIS — J309 Allergic rhinitis, unspecified: Secondary | ICD-10-CM

## 2021-12-09 DIAGNOSIS — J45998 Other asthma: Secondary | ICD-10-CM | POA: Diagnosis not present

## 2021-12-09 DIAGNOSIS — T63481D Toxic effect of venom of other arthropod, accidental (unintentional), subsequent encounter: Secondary | ICD-10-CM

## 2021-12-09 DIAGNOSIS — J302 Other seasonal allergic rhinitis: Secondary | ICD-10-CM

## 2021-12-09 MED ORDER — ARMONAIR DIGIHALER 113 MCG/ACT IN AEPB: 1.0000 | INHALATION_SPRAY | Freq: Two times a day (BID) | RESPIRATORY_TRACT | 5 refills | Status: AC

## 2021-12-09 MED ORDER — LEVOCETIRIZINE DIHYDROCHLORIDE 5 MG PO TABS: 5.0000 mg | ORAL_TABLET | Freq: Every evening | ORAL | 3 refills | Status: DC

## 2021-12-09 MED ORDER — ALBUTEROL SULFATE (2.5 MG/3ML) 0.083% IN NEBU: 2.5000 mg | INHALATION_SOLUTION | Freq: Four times a day (QID) | RESPIRATORY_TRACT | 1 refills | Status: DC | PRN

## 2021-12-09 MED ORDER — ALBUTEROL SULFATE (2.5 MG/3ML) 0.083% IN NEBU: 2.5000 mg | INHALATION_SOLUTION | Freq: Four times a day (QID) | RESPIRATORY_TRACT | 0 refills | Status: DC | PRN

## 2021-12-09 MED ORDER — MONTELUKAST SODIUM 10 MG PO TABS: 10.0000 mg | ORAL_TABLET | Freq: Every day | ORAL | 3 refills | Status: DC

## 2021-12-09 MED ORDER — OMEPRAZOLE 40 MG PO CPDR: 40.0000 mg | DELAYED_RELEASE_CAPSULE | Freq: Every day | ORAL | 0 refills | Status: DC

## 2021-12-09 NOTE — Progress Notes (Signed)
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 EoE - check on dysphagia symptoms at the next visit   Plan/Recommendations:   1. Mild persistent asthma, uncomplicated - Lung testing looked amazing today.  - We are go try to send in Helix instead of Flovent to see if this is going to be cheaper.  - Use up the Flovent you have though, before starting this next medicine.  - Nebulizer machine and demonstration provided today. - Daily controller medication(s): FLOVENT 138mcg 1 puff twice daily with spacer and Singulair (montelukast) 10 mg daily  - Prior to physical activity: albuterol 2 puffs 10-15 minutes before physical activity. - Rescue medications: albuterol 4 puffs every 4-6 hours as needed or albuterol nebulizer one vial every 4-6 hours as needed - Asthma control goals:  * Full participation in all desired activities (may need albuterol before activity) * Albuterol use two time or less a week on average (not counting use with activity) * Cough interfering with sleep two time or less a month * Oral steroids no more than once a year * No hospitalizations  2. Seasonal and perennial allergic rhinitis (grasses, ragweed, weeds, trees, indoor molds, outdoor molds, dust mites, cat, dog and cockroach) - Continue with: Singulair (montelukast) 10mg  daily and Flonase (fluticasone) one spray per nostril daily and the Xyzal 5mg  (can use twice daily) - Continue with allergy shots at the same schedule. - You should start to get some more relief at this point since you are not on the top dose yet.  - Use the HEPA filter in the bedroom and make sure that you change the filter.  3. Fire ant anaphylaxis - has EpiPen - EpiPen  is up to date.   4. GERD  - Try taking Prilosec 40mg  daily to see if this helps with your choking sensation.  - This might be a manifestation of eosinophilic esophagitis.  - I would continue to avoid your triggers (especially the milk).   5. Return in about 3 months (around 03/11/2022).  Subjective:   Kristina Garza is a 46 y.o. female presenting today for follow up of  Chief Complaint  Patient presents with   Allergies    Draining which causes a cough in the evening that the cough is the worst and can't catch her breath.     Kristina Garza has a history of the following: Patient Active Problem List   Diagnosis Date Noted   Fire any allergy 10/30/2020   Seasonal and perennial allergic rhinitis 10/30/2020   Obesity 08/31/2020   Mild persistent asthma, uncomplicated [id number]    History obtained from: chart review and patient.  Kristina Garza is a 46 y.o. female presenting for a follow up visit. Kristina Garza was last seen in August 2022.  At that time, her lung testing looked amazing.  We continue with Flovent 110 mcg 1 puff twice daily with spacer as well as Singulair 10 mg daily.  For her allergic rhinitis, would continue with Singulair as well as Flonase and Xyzal.  She decided to start allergen immunotherapy.  We made sure her EpiPen was up-to-date for her allergy shots as well as her history of fire ant anaphylaxis.  We gave her ideas on limiting her exposure to poison  ivy as well as postexposure treatment.  Since the last visit, she has mostly done well.   Asthma/Respiratory Symptom History: She has had more breathing issues this week with the trees and the PND.  She is requesting a nebulizer to use as needed. She refileld her albuterol a couple of months ago, but typically it is once a year or so. She remains on the Flovent one puff twice daily.   Allergic Rhinitis Symptom History: She is still using her medications a lot. She thought that her symptoms were getting better on the shots, but it  certainly has not been the case over the last week. She is not up to her Red Vial yet. She is hoping that this is going to get better over time.   Kristina Garza is on allergen immunotherapy. She receives two injections. Immunotherapy script #1 contains trees, weeds, grasses, cat, and dog. She currently receives 0.72mL of the GREEN vial (1/1,000). Immunotherapy script #2 contains  ragweed, molds, dust mites, and cockroach. She currently receives 0.38mL of the GREEN vial (1/1,000). She started shots September of 2022 and not yet reached maintenance.  Food Allergy Symptom History: She continues to avoid milk and this seems to help her choking sensation.  She is not on reflux medication. They did not see signs of a reflux medication. She does have a small hiatal hernia. She never had an endoscopy performed, only a swallowing study. But she does report that her symptoms get WORSE during the allergy season.   She did have a corneal abrasion in late August 2022.  She has remained busy at work.  She continues to work in the same job that [employer].  We then proceeded to have a long conversation about inflation versus corporate greed.  She said that in contrast to gas prices, grocery prices really decrease.  Otherwise, there have been no changes to her past medical history, surgical history, family history, or social history.    Review of Systems  Constitutional: Negative.  Negative for fever, malaise/fatigue and weight loss.  HENT:  Negative for congestion, ear discharge, ear pain and sinus pain.   Eyes:  Negative for pain, discharge and redness.  Respiratory:  Negative for cough, sputum production, shortness of breath and wheezing.   Cardiovascular: Negative.  Negative for chest pain and palpitations.  Gastrointestinal:  Negative for abdominal pain, constipation, diarrhea, heartburn, nausea and vomiting.  Skin: Negative.  Negative for itching and rash.  Neurological:  Negative for  dizziness and headaches.  Endo/Heme/Allergies:  Positive for environmental allergies. Does not bruise/bleed easily.      Objective:   Blood pressure 128/82, pulse 72, SpO2 98 %. There is no height or weight on file to calculate BMI.    Physical Exam Vitals reviewed.  Constitutional:      Appearance: She is well-developed.     Comments: Pleasant and talkative.   HENT:     Head: Normocephalic and atraumatic.     Right Ear: Tympanic membrane, ear canal and external ear normal.     Left Ear: Tympanic membrane, ear canal and external ear normal.     Nose: No nasal deformity, septal deviation or mucosal edema.     Right Turbinates: Enlarged, swollen and pale.     Left Turbinates: Enlarged, swollen and pale.     Right Sinus: No maxillary sinus tenderness or frontal sinus tenderness.     Left Sinus: No maxillary sinus tenderness or frontal sinus tenderness.     Comments: Some  clear rhinorrhea present. No polyps.     Mouth/Throat:     Mouth: Mucous membranes are not pale and not dry.     Pharynx: Uvula midline.     Comments: Cobblestoning in the posterior oropharynx noted.  Eyes:     General: Lids are normal. No allergic shiner.       Right eye: No discharge.        Left eye: No discharge.     Conjunctiva/sclera: Conjunctivae normal.     Right eye: Right conjunctiva is not injected. No chemosis.    Left eye: Left conjunctiva is not injected. No chemosis.    Pupils: Pupils are equal, round, and reactive to light.  Cardiovascular:     Rate and Rhythm: Normal rate and regular rhythm.     Heart sounds: Normal heart sounds.  Pulmonary:     Effort: Pulmonary effort is normal. No tachypnea, accessory muscle usage or respiratory distress.     Breath sounds: Normal breath sounds. No wheezing, rhonchi or rales.     Comments: Moving air well in all lung fields.  No increased work of breathing. Chest:     Chest wall: No tenderness.  Lymphadenopathy:     Cervical: No cervical adenopathy.   Skin:    General: Skin is warm.     Capillary Refill: Capillary refill takes less than 2 seconds.     Coloration: Skin is not pale.     Findings: No abrasion, erythema, petechiae or rash. Rash is not papular, urticarial or vesicular.     Comments: No eczematous or urticarial lesions noted.    Neurological:     Mental Status: She is alert.  Psychiatric:        Behavior: Behavior is cooperative.     Diagnostic studies:    Spirometry: results normal (FEV1: 2.61/82%, FVC: 3.20/81%, FEV1/FVC: 82%).    Spirometry consistent with normal pattern.   Allergy Studies: none       Salvatore Marvel, MD  [facility]

## 2021-12-09 NOTE — Patient Instructions (Addendum)
1. Mild persistent asthma, uncomplicated ?- Lung testing looked amazing today.  ?- We are go try to send in ArmonAir instead of Flovent to see if this is going to be cheaper.  ?- Use up the Flovent you have though, before starting this next medicine.  ?- Nebulizer machine and demonstration provided today. ?- Daily controller medication(s): FLOVENT 1 puff twice daily with spacer and Singulair (montelukast) 10 mg daily  ?- Prior to physical activity: albuterol 2 puffs 10-15 minutes before physical activity. ?- Rescue medications: albuterol 4 puffs every 4-6 hours as needed or albuterol nebulizer one vial every 4-6 hours as needed ?- Asthma control goals:  ?* Full participation in all desired activities (may need albuterol before activity) ?* Albuterol use two time or less a week on average (not counting use with activity) ?* Cough interfering with sleep two time or less a month ?* Oral steroids no more than once a year ?* No hospitalizations ? ?2. Seasonal and perennial allergic rhinitis (grasses, ragweed, weeds, trees, indoor molds, outdoor molds, dust mites, cat, dog and cockroach) ?- Continue with: Singulair (montelukast) 10mg  daily and Flonase (fluticasone) one spray per nostril daily and the Xyzal 5mg  (can use twice daily) ?- Continue with allergy shots at the same schedule. ?- You should start to get some more relief at this point since you are not on the top dose yet.  ?- Use the HEPA filter in the bedroom and make sure that you change the filter. ? ?3. Fire anaphylaxis - has EpiPen ?- EpiPen is up to date.  ? ?4. GERD  ?- Try taking Prilosec 40mg  daily to see if this helps with your choking sensation.  ?- This might be a manifestation of eosinophilic esophagitis.  ?- I would continue to avoid your triggers (especially the milk).  ? ?5. Return in about 3 months (around 03/11/2022). ? ? ?Please inform [facility] of any Emergency Department visits, hospitalizations, or changes in symptoms. Call before going  to the ED for breathing or allergy symptoms since we might be able to fit you in for a sick visit. Feel free to contact 05/11/2022 anytime with any questions, problems, or concerns. ? ?It was a pleasure to see you again today! ? ?Websites that have reliable patient information: ?1. American Academy of Asthma, Allergy, and Immunology: www.aaaai.org ?2. Food Allergy Research and Education (FARE): foodallergy.org ?3. Mothers of Asthmatics: http://www.asthmacommunitynetwork.org ?4. [place] of Allergy, Asthma, and Immunology: [place] ? ? ?COVID-19 Vaccine Information can be found at: [place] For questions related to vaccine distribution or appointments, please email [email or web address] or call [phone].  ? ?We realize that you might be concerned about having an allergic reaction to the COVID19 vaccines. To help with that concern, WE ARE OFFERING THE COVID19 VACCINES IN OUR OFFICE! Ask the front desk for dates!  ? ? ? ??Like? [email or web address] on Facebook and Instagram for our latest updates!  ?  ? ? ?A healthy democracy works best when [email or web address] participate! Make sure you are registered to vote! If you have moved or changed any of your contact information, you will need to get this updated before voting! ? ?In some cases, you MAY be able to register to vote online: [phone] ? ? ? ? ?

## 2021-12-13 ENCOUNTER — Other Ambulatory Visit: Payer: Self-pay | Admitting: *Deleted

## 2021-12-13 MED ORDER — ALBUTEROL SULFATE (2.5 MG/3ML) 0.083% IN NEBU: 2.5000 mg | INHALATION_SOLUTION | Freq: Four times a day (QID) | RESPIRATORY_TRACT | 1 refills | Status: DC | PRN

## 2021-12-16 ENCOUNTER — Ambulatory Visit (INDEPENDENT_AMBULATORY_CARE_PROVIDER_SITE_OTHER): Payer: BC Managed Care – PPO

## 2021-12-16 DIAGNOSIS — J309 Allergic rhinitis, unspecified: Secondary | ICD-10-CM | POA: Diagnosis not present

## 2021-12-20 ENCOUNTER — Other Ambulatory Visit: Payer: Self-pay | Admitting: *Deleted

## 2021-12-20 MED ORDER — ALBUTEROL SULFATE (2.5 MG/3ML) 0.083% IN NEBU
2.5000 mg | INHALATION_SOLUTION | Freq: Four times a day (QID) | RESPIRATORY_TRACT | 1 refills | Status: AC | PRN
Start: 2021-12-20 — End: ?

## 2021-12-21 ENCOUNTER — Ambulatory Visit (INDEPENDENT_AMBULATORY_CARE_PROVIDER_SITE_OTHER): Payer: BC Managed Care – PPO | Admitting: *Deleted

## 2021-12-21 DIAGNOSIS — J309 Allergic rhinitis, unspecified: Secondary | ICD-10-CM

## 2021-12-28 ENCOUNTER — Ambulatory Visit (INDEPENDENT_AMBULATORY_CARE_PROVIDER_SITE_OTHER): Payer: BC Managed Care – PPO

## 2021-12-28 DIAGNOSIS — J309 Allergic rhinitis, unspecified: Secondary | ICD-10-CM

## 2022-01-04 ENCOUNTER — Ambulatory Visit (INDEPENDENT_AMBULATORY_CARE_PROVIDER_SITE_OTHER): Payer: BC Managed Care – PPO

## 2022-01-04 DIAGNOSIS — J309 Allergic rhinitis, unspecified: Secondary | ICD-10-CM

## 2022-01-11 ENCOUNTER — Ambulatory Visit (INDEPENDENT_AMBULATORY_CARE_PROVIDER_SITE_OTHER): Payer: BC Managed Care – PPO

## 2022-01-11 DIAGNOSIS — J309 Allergic rhinitis, unspecified: Secondary | ICD-10-CM

## 2022-01-18 ENCOUNTER — Ambulatory Visit (INDEPENDENT_AMBULATORY_CARE_PROVIDER_SITE_OTHER): Payer: BC Managed Care – PPO

## 2022-01-18 DIAGNOSIS — J309 Allergic rhinitis, unspecified: Secondary | ICD-10-CM | POA: Diagnosis not present

## 2022-01-24 ENCOUNTER — Ambulatory Visit
Admission: EM | Admit: 2022-01-24 | Discharge: 2022-01-24 | Disposition: A | Discharge status: Home / Self Care | Payer: BC Managed Care – PPO | Attending: Family Medicine | Admitting: Family Medicine

## 2022-01-24 DIAGNOSIS — J029 Acute pharyngitis, unspecified: Secondary | ICD-10-CM | POA: Insufficient documentation

## 2022-01-24 LAB — POCT RAPID STREP A (OFFICE): Rapid Strep A Screen: NEGATIVE

## 2022-01-24 MED ORDER — LIDOCAINE VISCOUS HCL 2 % MT SOLN: 10.0000 mL | OROMUCOSAL | 0 refills | Status: DC | PRN

## 2022-01-24 NOTE — ED Provider Notes (Signed)
?[facility] ? ? ? ?CSN: [id number] ?Arrival date & time: 01/24/22  0847 ? ? ?  ? ?History   ?Chief Complaint ?Chief Complaint  ?Patient presents with  ? Sore Throat  ? ? ?HPI ?Kristina Garza is a 46 y.o. female.  ? ?Presenting today with 1 day history of significant sore throat, postnasal drip and congestion.  Denies fever, chills, cough, chest pain, shortness of breath, abdominal pain, nausea vomiting or diarrhea.  No known sick contacts recently.  States she has a history of bad seasonal allergies for which she gets shots and takes antihistamines, angular, Flonase daily.  Has been gargling with warm salt water with mild relief of current sore throat. ? ? ?Past Medical History:  ?Diagnosis Date  ? Allergy   ? Asthma   ? ? ?Patient Active Problem List  ? Diagnosis Date Noted  ? Fire any allergy 10/30/2020  ? Seasonal and perennial allergic rhinitis 10/30/2020  ? Obesity 08/31/2020  ? Mild persistent asthma, uncomplicated 07/28/2020  ? ? ?Past Surgical History:  ?Procedure Laterality Date  ? FOOT SURGERY Left 2007  ? 5th Metatarsal broken pin placed  ? Knee surgery Right 2016  ? miniscus removed  ? ? ?OB History   ?No obstetric history on file. ?  ? ? ? ?Home Medications   ? ?Prior to Admission medications   ?Medication Sig Start Date End Date Taking? Authorizing Provider  ?lidocaine (XYLOCAINE) 2 % solution Use as directed 10 mLs in the mouth or throat every 3 (three) hours as needed for mouth pain. 01/24/22  Yes Particia Nearing, PA-C  ?albuterol [facility] HFA) 108 (90 Base) MCG/ACT inhaler Inhale 2 puffs into the lungs every 6 (six) hours as needed for wheezing or shortness of breath. 09/07/21   Alfonse Spruce, MD  ?albuterol (PROVENTIL) (2.5 MG/3ML) 0.083% nebulizer solution Take 3 mLs (2.5 mg total) by nebulization every 6 (six) hours as needed for wheezing or shortness of breath. 12/20/21   Alfonse Spruce, MD  ?EPINEPHrine 0.3 mg/0.3 mL IJ SOAJ injection SMARTSIG:0.3 Milliliter(s) IM  Once PRN 11/07/20   [provider]  ?levocetirizine (XYZAL) 5 MG tablet Take 1 tablet (5 mg total) by mouth every evening. 12/09/21 03/09/22  Alfonse Spruce, MD  ?montelukast (SINGULAIR) 10 MG tablet Take 1 tablet (10 mg total) by mouth at bedtime. 12/09/21 03/09/22  Alfonse Spruce, MD  ?omeprazole (PRILOSEC) 40 MG capsule Take 1 capsule (40 mg total) by mouth daily. 12/09/21 03/09/22  Alfonse Spruce, MD  ?triamcinolone cream (KENALOG) 0.1 % Apply to affected area 1-2 times daily 07/28/20   Jarold Motto, PA  ? ? ?Family History ?Family History  ?Problem Relation Age of Onset  ? Asthma Mother   ? Osteoarthritis Mother   ? Hyperlipidemia Father   ? Hypertension Father   ? Heart attack Father   ? Sleep apnea Father   ? Hyperlipidemia Paternal Grandmother   ? Hypertension Paternal Grandmother   ? Heart attack Paternal Grandmother   ? Breast cancer Neg Hx   ? Colon cancer Neg Hx   ? Stomach cancer Neg Hx   ? Esophageal cancer Neg Hx   ? Pancreatic cancer Neg Hx   ? ? ?Social History ?Social History  ? ?Tobacco Use  ? Smoking status: Never  ?  Passive exposure: Never  ? Smokeless tobacco: Never  ?Vaping Use  ? Vaping Use: Never used  ?Substance Use Topics  ? Alcohol use: Yes  ?  Alcohol/week: 0.0 -  2.0 standard drinks  ? Drug use: Never  ? ? ? ?Allergies   ?Pollen extract ? ? ?Review of Systems ?Review of Systems ?Per HPI ? ?Physical Exam ?Triage Vital Signs ?ED Triage Vitals  ?Enc Vitals Group  ?   BP 01/24/22 0958 133/87  ?   Pulse Rate 01/24/22 0958 70  ?   Resp 01/24/22 0958 18  ?   Temp 01/24/22 0958 98.1 ?F (36.7 ?C)  ?   Temp Source 01/24/22 0958 Oral  ?   SpO2 01/24/22 0958 98 %  ?   Weight --   ?   Height --   ?   Head Circumference --   ?   Peak Flow --   ?   Pain Score 01/24/22 0938 5  ?   Pain Loc --   ?   Pain Edu? --   ?   Excl. in GC? --   ? ?No data found. ? ?Updated Vital Signs ?BP 133/87 (BP Location: Right Arm)   Pulse 70   Temp 98.1 ?F (36.7 ?C) (Oral)   Resp 18   LMP  01/19/2022 (Exact Date)   SpO2 98%  ? ?Visual Acuity ?Right Eye Distance:   ?Left Eye Distance:   ?Bilateral Distance:   ? ?Right Eye Near:   ?Left Eye Near:    ?Bilateral Near:    ? ?Physical Exam ?Vitals and nursing note reviewed.  ?Constitutional:   ?   Appearance: Normal appearance. She is not ill-appearing.  ?HENT:  ?   Head: Atraumatic.  ?   Right Ear: Tympanic membrane and external ear normal.  ?   Left Ear: Tympanic membrane and external ear normal.  ?   Nose: Rhinorrhea present.  ?   Mouth/Throat:  ?   Mouth: Mucous membranes are moist.  ?   Pharynx: Posterior oropharyngeal erythema present. No oropharyngeal exudate.  ?Eyes:  ?   Extraocular Movements: Extraocular movements intact.  ?   Conjunctiva/sclera: Conjunctivae normal.  ?Cardiovascular:  ?   Rate and Rhythm: Normal rate and regular rhythm.  ?   Heart sounds: Normal heart sounds.  ?Pulmonary:  ?   Effort: Pulmonary effort is normal.  ?   Breath sounds: Normal breath sounds. No wheezing or rales.  ?Musculoskeletal:     ?   General: Normal range of motion.  ?   Cervical back: Normal range of motion and neck supple.  ?Lymphadenopathy:  ?   Cervical: No cervical adenopathy.  ?Skin: ?   General: Skin is warm and dry.  ?Neurological:  ?   Mental Status: She is alert and oriented to person, place, and time.  ?Psychiatric:     ?   Mood and Affect: Mood normal.     ?   Thought Content: Thought content normal.     ?   Judgment: Judgment normal.  ? ? ? ?UC Treatments / Results  ?Labs ?(all labs ordered are listed, but only abnormal results are displayed) ?Labs Reviewed  ?CULTURE, GROUP A STREP [facility])  ?COVID-19, FLU A+B NAA  ?POCT RAPID STREP A (OFFICE)  ? ? ?EKG ? ? ?Radiology ?No results found. ? ?Procedures ?Procedures (including critical care time) ? ?Medications Ordered in UC ?Medications - No data to display ? ?Initial Impression / Assessment and Plan / UC Course  ?I have reviewed the triage vital signs and the nursing notes. ? ?Pertinent labs & imaging  results that were available during my care of the patient were reviewed by me and  considered in my medical decision making (see chart for details). ? ?  ? ?Vital signs benign and reassuring, rapid strep negative, throat culture and COVID and flu testing pending.  Suspect viral sore throat.  Treat with viscous lidocaine, supportive over-the-counter medications and home care and continue allergy regimen.  Return for acutely worsening symptoms. ? ?Final Clinical Impressions(s) / UC Diagnoses  ? ?Final diagnoses:  ?Sore throat  ? ?Discharge Instructions   ?None ?  ? ?ED Prescriptions   ? ? Medication Sig Dispense Auth. Provider  ? lidocaine (XYLOCAINE) 2 % solution Use as directed 10 mLs in the mouth or throat every 3 (three) hours as needed for mouth pain. 100 mL Particia NearingLane, Donnika Kucher [place], [place]PA-C  ? ?  ? ?PDMP not reviewed this encounter. ?  ?Particia NearingLane, Safiyah Cisney Elizabeth, PA-C ?01/24/22 1016 ? ?

## 2022-01-24 NOTE — ED Triage Notes (Signed)
Pt states that yesterday she had a bad sore throat ? ?Pt states she has really bad allergies that she does get shots for ? ?Pt states she gargled with warm salt water and it helped a little bit ? ?Denies Fever ?

## 2022-01-26 LAB — COVID-19, FLU A+B NAA
Influenza A, NAA: NOT DETECTED
Influenza B, NAA: NOT DETECTED
SARS-CoV-2, NAA: NOT DETECTED

## 2022-01-27 ENCOUNTER — Ambulatory Visit (INDEPENDENT_AMBULATORY_CARE_PROVIDER_SITE_OTHER): Payer: BC Managed Care – PPO

## 2022-01-27 DIAGNOSIS — J309 Allergic rhinitis, unspecified: Secondary | ICD-10-CM

## 2022-01-27 LAB — CULTURE, GROUP A STREP (THRC)

## 2022-02-08 ENCOUNTER — Ambulatory Visit (INDEPENDENT_AMBULATORY_CARE_PROVIDER_SITE_OTHER): Payer: BC Managed Care – PPO

## 2022-02-08 DIAGNOSIS — J309 Allergic rhinitis, unspecified: Secondary | ICD-10-CM | POA: Diagnosis not present

## 2022-02-14 ENCOUNTER — Other Ambulatory Visit: Payer: Self-pay | Admitting: Allergy & Immunology

## 2022-02-17 ENCOUNTER — Ambulatory Visit (INDEPENDENT_AMBULATORY_CARE_PROVIDER_SITE_OTHER): Payer: BC Managed Care – PPO

## 2022-02-17 DIAGNOSIS — J309 Allergic rhinitis, unspecified: Secondary | ICD-10-CM

## 2022-02-22 ENCOUNTER — Ambulatory Visit (INDEPENDENT_AMBULATORY_CARE_PROVIDER_SITE_OTHER): Payer: BC Managed Care – PPO

## 2022-02-22 DIAGNOSIS — J309 Allergic rhinitis, unspecified: Secondary | ICD-10-CM

## 2022-03-01 ENCOUNTER — Ambulatory Visit (INDEPENDENT_AMBULATORY_CARE_PROVIDER_SITE_OTHER): Payer: BC Managed Care – PPO

## 2022-03-01 DIAGNOSIS — J309 Allergic rhinitis, unspecified: Secondary | ICD-10-CM

## 2022-03-08 ENCOUNTER — Ambulatory Visit (INDEPENDENT_AMBULATORY_CARE_PROVIDER_SITE_OTHER): Payer: BC Managed Care – PPO

## 2022-03-08 DIAGNOSIS — J309 Allergic rhinitis, unspecified: Secondary | ICD-10-CM

## 2022-03-15 ENCOUNTER — Encounter: Payer: Self-pay | Admitting: Family

## 2022-03-15 ENCOUNTER — Ambulatory Visit: Payer: Self-pay

## 2022-03-15 ENCOUNTER — Ambulatory Visit: Payer: BC Managed Care – PPO | Admitting: Allergy & Immunology

## 2022-03-15 ENCOUNTER — Ambulatory Visit (INDEPENDENT_AMBULATORY_CARE_PROVIDER_SITE_OTHER): Payer: BC Managed Care – PPO | Admitting: Family

## 2022-03-15 VITALS — BP 124/82 | HR 73 | Temp 98.5°F | Resp 18 | Ht 66.0 in | Wt 264.1 lb

## 2022-03-15 DIAGNOSIS — K219 Gastro-esophageal reflux disease without esophagitis: Secondary | ICD-10-CM | POA: Diagnosis not present

## 2022-03-15 DIAGNOSIS — J302 Other seasonal allergic rhinitis: Secondary | ICD-10-CM

## 2022-03-15 DIAGNOSIS — J4530 Mild persistent asthma, uncomplicated: Secondary | ICD-10-CM

## 2022-03-15 DIAGNOSIS — J309 Allergic rhinitis, unspecified: Secondary | ICD-10-CM

## 2022-03-15 DIAGNOSIS — T63481D Toxic effect of venom of other arthropod, accidental (unintentional), subsequent encounter: Secondary | ICD-10-CM | POA: Diagnosis not present

## 2022-03-15 MED ORDER — EPINEPHRINE 0.3 MG/0.3ML IJ SOAJ: INTRAMUSCULAR | 1 refills | Status: DC

## 2022-03-15 NOTE — Progress Notes (Signed)
[place], [address] [place] [address] Dept: [phone]  FOLLOW UP NOTE  Patient ID: Kristina Garza, female    DOB: 18-Sep-1976  Age: 46 y.o. MRN: [id number] Date of Office Visit: 03/15/2022  Assessment  Chief Complaint: Asthma (Asthma has been fine since her last visit. No trouble breathing ) and Allergic Rhinitis  (Allergies have been doing better. Feels as if her allergy shots make a difference. Did stop 2 of her allergy meds (XYZAL, montelukast) )  HPI Kristina Garza is a 46 year old female who presents today for follow-up of mild persistent asthma, seasonal and perennial allergic rhinitis, fire ant anaphylaxis, and gastroesophageal reflux disease.  She was last seen on December 09, 2021 by Dr. Ernst Bowler.  She denies any new diagnosis or surgery since her last office visit.  Mild persistent asthma is reported as controlled with Flovent 110 mcg 1 puff twice a day with spacer and albuterol as needed.  She stopped taking Singulair when she got to maintenance dose with her allergy injections and has been doing pretty good off.  She has been off Singulair for a month now.  She denies cough, wheeze, tightness in chest, shortness of breath, and nocturnal awakenings due to breathing problems.  Since her last office visit she has not required any systemic steroids or made any trips to the emergency room or urgent care due to breathing problems.  She has used her albuterol a couple times since her last office visit.  Seasonal and perennial allergic rhinitis is reported as controlled with Flonase as needed and allergy injections per protocol.  She stopped taking Xyzal 5 mg once a day also when she reached maintenance on her allergy injections and has done fine since being off from month.  She denies rhinorrhea, nasal congestion, and postnasal drip.  She does feel like her allergy injections help.  Approximately 2 weeks ago when she got her allergy injection she did have a whelp on both arms.  She was  given an injection out of the new vial.  Last week she did not have any issues with her allergy injections.  When she has the whelp at the injection site it does go away quickly.  She reports that her EpiPen is up-to-date.  She has not had any fire ant bites since her last office visit and has not had to use her EpiPen.  She reports that her EpiPen is up-to-date.  She is no longer taking Prilosec 40 mg once a day, because she is no longer having a choking sensation.  She reports that this all started after having certain foods such as dairy or creamy foods and she would then have to use her albuterol inhaler to breathe.  Her allergy testing to foods is reported as negative.  She then went back on her routine allergy medications and the choking sensation went away.  She does occasionally have reflux symptoms, but this is only if she eats something that she should not eat or if she eats too late.  She will occasionally take Pepcid or Prilosec if needed.   Drug Allergies:  Allergies  Allergen Reactions   Pollen Extract Cough and Shortness Of Breath    Review of Systems: Review of Systems  Constitutional:  Negative for chills and fever.  HENT:         Denies rhinorrhea, nasal congestion, and postnasal drip  Eyes:        Reports itchy watery eyes at times  Respiratory:  Negative for cough, shortness  of breath and wheezing.        Denies cough, wheeze, tightness in chest, shortness of breath, and nocturnal awakenings due to breathing problems  Cardiovascular:  Negative for chest pain and palpitations.  Gastrointestinal:        Reports reflux symptoms only if she eats something that she should not or eats late at night.  She reports that she is no longer having the choking sensation that she was having.  Genitourinary:  Negative for frequency.  Skin:  Positive for itching and rash.       Reports poison ivy on her left leg, that is mild and getting better  Neurological:  Negative for headaches.   Endo/Heme/Allergies:  Positive for environmental allergies.    Physical Exam: BP 124/82   Pulse 73   Temp 98.5 F (36.9 C)   Resp 18   Ht 5\' 6"  (1.676 m)   Wt 264 lb 2 oz (119.8 kg)   SpO2 98%   BMI 42.63 kg/m    Physical Exam Constitutional:      Appearance: Normal appearance.  HENT:     Head: Normocephalic and atraumatic.     Comments: Pharynx normal, eyes normal, ears normal, nose normal    Right Ear: Tympanic membrane, ear canal and external ear normal.     Left Ear: Tympanic membrane, ear canal and external ear normal.     Nose: Nose normal.     Mouth/Throat:     Mouth: Mucous membranes are moist.     Pharynx: Oropharynx is clear.  Eyes:     Conjunctiva/sclera: Conjunctivae normal.  Cardiovascular:     Rate and Rhythm: Normal rate and regular rhythm.     Heart sounds: Normal heart sounds.  Pulmonary:     Effort: Pulmonary effort is normal.     Breath sounds: Normal breath sounds.     Comments: Lungs clear to auscultation Musculoskeletal:     Cervical back: Neck supple.  Skin:    General: Skin is warm.  Neurological:     Mental Status: She is alert and oriented to person, place, and time.  Psychiatric:        Mood and Affect: Mood normal.        Behavior: Behavior normal.        Thought Content: Thought content normal.        Judgment: Judgment normal.    Diagnostics:  Will get at next office visit. Breathing symptoms well controlled  Assessment and Plan: 1. Mild persistent asthma, uncomplicated   2. Seasonal and perennial allergic rhinitis   3. Insect sting allergy, current reaction, accidental or unintentional, subsequent encounter   4. Gastroesophageal reflux disease, unspecified whether esophagitis present     Meds ordered this encounter  Medications   EPINEPHrine 0.3 mg/0.3 mL IJ SOAJ injection    Sig: Use as directed for anaphylactic reactions    Dispense:  1 each    Refill:  1    Patient Instructions  1. Mild persistent asthma,  uncomplicated - Daily controller medication(s): FLOVENT 163mcg 1 puff twice daily with spacer and Singulair (montelukast) 10 mg daily  - Prior to physical activity: albuterol 2 puffs 10-15 minutes before physical activity. - Rescue medications: albuterol 4 puffs every 4-6 hours as needed or albuterol nebulizer one vial every 4-6 hours as needed - Asthma control goals:  * Full participation in all desired activities (may need albuterol before activity) * Albuterol use two time or less a week on average (not counting  use with activity) * Cough interfering with sleep two time or less a month * Oral steroids no more than once a year * No hospitalizations  2. Seasonal and perennial allergic rhinitis (grasses, ragweed, weeds, trees, indoor molds, outdoor molds, dust mites, cat, dog and cockroach) Ok to stop Singulair and Xyzal since you are not having any symptoms since stopping for the past month. You may restart if symptoms return - Continue with:  Flonase (fluticasone) one spray per nostril daily as needed  - Continue with allergy shots at the same schedule and have access to your EpiPen - You should start to get some more relief at this point since you are not on the top dose yet.  - Use the HEPA filter in the bedroom and make sure that you change the filter.  3. 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   4. GERD  -Ok to stop Prilosec since you are no longer having choking sensation -she also is controlling her reflux symptoms with her diet.  Lifestyle Changes for Controlling GERD When you have GERD, stomach acid feels as if it's backing up toward your mouth. Whether or not you take medication to control your GERD, your symptoms can often be improved with lifestyle changes.   Raise Your Head Reflux is more likely to strike when you're lying down flat, because stomach fluid can flow backward more easily. Raising the head of your bed 4-6 inches can help. To do this: Slide  blocks or books under the legs at the head of your bed. Or, place a wedge under the mattress. Many foam stores can make a suitable wedge for you. The wedge should run from your waist to the top of your head. Don't just prop your head on several pillows. This increases pressure on your stomach. It can make GERD worse.  Watch Your Eating Habits Certain foods may increase the acid in your stomach or relax the lower esophageal sphincter, making GERD more likely. It's best to avoid the following: Coffee, tea, and carbonated drinks (with and without caffeine) Fatty, fried, or spicy food Mint, chocolate, onions, and tomatoes Any other foods that seem to irritate your stomach or cause you pain  Relieve the Pressure Eat smaller meals, even if you have to eat more often. Don't lie down right after you eat. Wait a few hours for your stomach to empty. Avoid tight belts and tight-fitting clothes. Lose excess weight.  Tobacco and Alcohol Avoid smoking tobacco and drinking alcohol. They can make GERD symptoms worse.   5. Schedule a follow up appointment in 4-6 months or sooner if needed     Return in about 6 months (around 09/14/2022), or if symptoms worsen or fail to improve.    Thank you for the opportunity to care for this patient.  Please do not hesitate to contact me with questions.  Althea Charon, FNP [facility]

## 2022-03-15 NOTE — Patient Instructions (Addendum)
1. Mild persistent asthma, uncomplicated - Daily controller medication(s): FLOVENT 1 puff twice daily with spacer and Singulair (montelukast) 10 mg daily  - Prior to physical activity: albuterol 2 puffs 10-15 minutes before physical activity. - Rescue medications: albuterol 4 puffs every 4-6 hours as needed or albuterol nebulizer one vial every 4-6 hours as needed - Asthma control goals:  * Full participation in all desired activities (may need albuterol before activity) * Albuterol use two time or less a week on average (not counting use with activity) * Cough interfering with sleep two time or less a month * Oral steroids no more than once a year * No hospitalizations  2. Seasonal and perennial allergic rhinitis (grasses, ragweed, weeds, trees, indoor molds, outdoor molds, dust mites, cat, dog and cockroach) Ok to stop Singulair and Xyzal since you are not having any symptoms since stopping for the past month. You may restart if symptoms return - Continue with:  Flonase (fluticasone) one spray per nostril daily as needed  - Continue with allergy shots at the same schedule and have access to your EpiPen - You should start to get some more relief at this point since you are not on the top dose yet.  - Use the HEPA filter in the bedroom and make sure that you change the filter.  3. 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   4. GERD  -Ok to stop Prilosec since you are no longer having choking sensation -she also is controlling her reflux symptoms with her diet.  Lifestyle Changes for Controlling GERD When you have GERD, stomach acid feels as if it's backing up toward your mouth. Whether or not you take medication to control your GERD, your symptoms can often be improved with lifestyle changes.   Raise Your Head Reflux is more likely to strike when you're lying down flat, because stomach fluid can flow backward more easily. Raising the head of your bed 4-6 inches  can help. To do this: Slide blocks or books under the legs at the head of your bed. Or, place a wedge under the mattress. Many foam stores can make a suitable wedge for you. The wedge should run from your waist to the top of your head. Don't just prop your head on several pillows. This increases pressure on your stomach. It can make GERD worse.  Watch Your Eating Habits Certain foods may increase the acid in your stomach or relax the lower esophageal sphincter, making GERD more likely. It's best to avoid the following: Coffee, tea, and carbonated drinks (with and without caffeine) Fatty, fried, or spicy food Mint, chocolate, onions, and tomatoes Any other foods that seem to irritate your stomach or cause you pain  Relieve the Pressure Eat smaller meals, even if you have to eat more often. Don't lie down right after you eat. Wait a few hours for your stomach to empty. Avoid tight belts and tight-fitting clothes. Lose excess weight.  Tobacco and Alcohol Avoid smoking tobacco and drinking alcohol. They can make GERD symptoms worse.   5. Schedule a follow up appointment in 4-6 months or sooner if needed

## 2022-03-24 ENCOUNTER — Ambulatory Visit (INDEPENDENT_AMBULATORY_CARE_PROVIDER_SITE_OTHER): Payer: BC Managed Care – PPO

## 2022-03-24 DIAGNOSIS — J309 Allergic rhinitis, unspecified: Secondary | ICD-10-CM

## 2022-03-29 ENCOUNTER — Ambulatory Visit (INDEPENDENT_AMBULATORY_CARE_PROVIDER_SITE_OTHER): Payer: BC Managed Care – PPO

## 2022-03-29 DIAGNOSIS — J309 Allergic rhinitis, unspecified: Secondary | ICD-10-CM

## 2022-04-03 DIAGNOSIS — J3089 Other allergic rhinitis: Secondary | ICD-10-CM

## 2022-04-05 ENCOUNTER — Ambulatory Visit (INDEPENDENT_AMBULATORY_CARE_PROVIDER_SITE_OTHER): Payer: BC Managed Care – PPO

## 2022-04-05 DIAGNOSIS — J309 Allergic rhinitis, unspecified: Secondary | ICD-10-CM

## 2022-04-12 ENCOUNTER — Ambulatory Visit (INDEPENDENT_AMBULATORY_CARE_PROVIDER_SITE_OTHER): Payer: BC Managed Care – PPO

## 2022-04-12 DIAGNOSIS — J309 Allergic rhinitis, unspecified: Secondary | ICD-10-CM | POA: Diagnosis not present

## 2022-04-19 ENCOUNTER — Ambulatory Visit (INDEPENDENT_AMBULATORY_CARE_PROVIDER_SITE_OTHER): Payer: BC Managed Care – PPO

## 2022-04-19 DIAGNOSIS — J309 Allergic rhinitis, unspecified: Secondary | ICD-10-CM

## 2022-04-26 ENCOUNTER — Ambulatory Visit (INDEPENDENT_AMBULATORY_CARE_PROVIDER_SITE_OTHER): Payer: BC Managed Care – PPO

## 2022-04-26 DIAGNOSIS — J309 Allergic rhinitis, unspecified: Secondary | ICD-10-CM

## 2022-05-05 ENCOUNTER — Ambulatory Visit (INDEPENDENT_AMBULATORY_CARE_PROVIDER_SITE_OTHER): Payer: BC Managed Care – PPO

## 2022-05-05 DIAGNOSIS — J309 Allergic rhinitis, unspecified: Secondary | ICD-10-CM

## 2022-05-10 ENCOUNTER — Ambulatory Visit (INDEPENDENT_AMBULATORY_CARE_PROVIDER_SITE_OTHER): Payer: BC Managed Care – PPO

## 2022-05-10 DIAGNOSIS — J309 Allergic rhinitis, unspecified: Secondary | ICD-10-CM

## 2022-05-17 ENCOUNTER — Ambulatory Visit (INDEPENDENT_AMBULATORY_CARE_PROVIDER_SITE_OTHER): Payer: BC Managed Care – PPO

## 2022-05-17 DIAGNOSIS — J309 Allergic rhinitis, unspecified: Secondary | ICD-10-CM

## 2022-05-26 ENCOUNTER — Ambulatory Visit (INDEPENDENT_AMBULATORY_CARE_PROVIDER_SITE_OTHER): Payer: BC Managed Care – PPO | Admitting: *Deleted

## 2022-05-26 DIAGNOSIS — J309 Allergic rhinitis, unspecified: Secondary | ICD-10-CM | POA: Diagnosis not present

## 2022-05-31 ENCOUNTER — Ambulatory Visit (INDEPENDENT_AMBULATORY_CARE_PROVIDER_SITE_OTHER): Payer: BC Managed Care – PPO

## 2022-05-31 DIAGNOSIS — J309 Allergic rhinitis, unspecified: Secondary | ICD-10-CM | POA: Diagnosis not present

## 2022-06-14 ENCOUNTER — Ambulatory Visit (INDEPENDENT_AMBULATORY_CARE_PROVIDER_SITE_OTHER): Payer: BC Managed Care – PPO

## 2022-06-14 DIAGNOSIS — J309 Allergic rhinitis, unspecified: Secondary | ICD-10-CM

## 2022-06-21 ENCOUNTER — Ambulatory Visit (INDEPENDENT_AMBULATORY_CARE_PROVIDER_SITE_OTHER): Payer: BC Managed Care – PPO

## 2022-06-21 DIAGNOSIS — J309 Allergic rhinitis, unspecified: Secondary | ICD-10-CM | POA: Diagnosis not present

## 2022-06-28 ENCOUNTER — Ambulatory Visit (INDEPENDENT_AMBULATORY_CARE_PROVIDER_SITE_OTHER): Payer: BC Managed Care – PPO

## 2022-06-28 DIAGNOSIS — J309 Allergic rhinitis, unspecified: Secondary | ICD-10-CM

## 2022-07-03 ENCOUNTER — Encounter: Payer: Self-pay | Admitting: *Deleted

## 2022-07-05 ENCOUNTER — Ambulatory Visit (INDEPENDENT_AMBULATORY_CARE_PROVIDER_SITE_OTHER): Payer: BC Managed Care – PPO

## 2022-07-05 DIAGNOSIS — J309 Allergic rhinitis, unspecified: Secondary | ICD-10-CM

## 2022-07-10 NOTE — Progress Notes (Signed)
VIALS EXP 07-11-23 

## 2022-07-11 DIAGNOSIS — J3089 Other allergic rhinitis: Secondary | ICD-10-CM | POA: Diagnosis not present

## 2022-07-12 ENCOUNTER — Ambulatory Visit (INDEPENDENT_AMBULATORY_CARE_PROVIDER_SITE_OTHER): Payer: BC Managed Care – PPO

## 2022-07-12 ENCOUNTER — Encounter: Payer: Self-pay | Admitting: Physician Assistant

## 2022-07-12 DIAGNOSIS — J309 Allergic rhinitis, unspecified: Secondary | ICD-10-CM | POA: Diagnosis not present

## 2022-07-14 ENCOUNTER — Telehealth (INDEPENDENT_AMBULATORY_CARE_PROVIDER_SITE_OTHER): Payer: BC Managed Care – PPO | Admitting: Physician Assistant

## 2022-07-14 ENCOUNTER — Encounter: Payer: Self-pay | Admitting: Physician Assistant

## 2022-07-14 DIAGNOSIS — U071 COVID-19: Secondary | ICD-10-CM | POA: Diagnosis not present

## 2022-07-14 DIAGNOSIS — J4530 Mild persistent asthma, uncomplicated: Secondary | ICD-10-CM | POA: Diagnosis not present

## 2022-07-14 MED ORDER — NIRMATRELVIR/RITONAVIR (PAXLOVID)TABLET: 3.0000 | ORAL_TABLET | Freq: Two times a day (BID) | ORAL | 0 refills | Status: AC

## 2022-07-14 MED ORDER — PREDNISONE 20 MG PO TABS: 40.0000 mg | ORAL_TABLET | Freq: Every day | ORAL | 0 refills | Status: DC

## 2022-07-14 NOTE — Progress Notes (Signed)
Virtual Visit via Video   I connected with Kristina Garza on 07/14/22 at 11:20 AM EDT by a video enabled telemedicine application and verified that I am speaking with the correct person using two identifiers. Location patient: Home Location provider: [facility], Office Persons participating in the virtual visit: Kristina Garza, Inda Coke PA-C  I discussed the limitations of evaluation and management by telemedicine and the availability of in person appointments. The patient expressed understanding and agreed to proceed.  Subjective:   HPI:   COVID-19 Positive Symptom onset: 10/5 Travel/contacts: went to a concert in [place] recently   Vaccination status: has had 3 vaccines Testing results: positive 10/6  Patient endorses the following symptoms: cough, wheezing, fever, nasal congestion  Patient denies the following symptoms: chest pain, SOB  Treatments tried: nebulizer, tylenol, ibuprofen  Hx of asthma  ROS: See pertinent positives and negatives per HPI.  Patient Active Problem List   Diagnosis Date Noted   Fire any allergy 10/30/2020   Seasonal and perennial allergic rhinitis 10/30/2020   Obesity 08/31/2020   Mild persistent asthma, uncomplicated 62/37/6283    Social History   Tobacco Use   Smoking status: Never    Passive exposure: Never   Smokeless tobacco: Never  Substance Use Topics   Alcohol use: Yes    Alcohol/week: 0.0 - 2.0 standard drinks of alcohol    Current Outpatient Medications:    albuterol (PROAIR HFA) 108 (90 Base) MCG/ACT inhaler, Inhale 2 puffs into the lungs every 6 (six) hours as needed for wheezing or shortness of breath., Disp: 54 g, Rfl: 1   albuterol (PROVENTIL) (2.5 MG/3ML) 0.083% nebulizer solution, Take 3 mLs (2.5 mg total) by nebulization every 6 (six) hours as needed for wheezing or shortness of breath., Disp: 225 mL, Rfl: 1   EPINEPHrine 0.3 mg/0.3 mL IJ SOAJ injection, Use as directed for anaphylactic reactions, Disp: 1 each, Rfl:  1   lidocaine (XYLOCAINE) 2 % solution, Use as directed 10 mLs in the mouth or throat every 3 (three) hours as needed for mouth pain., Disp: 100 mL, Rfl: 0   nirmatrelvir/ritonavir EUA (PAXLOVID) 20 x 150 MG & 10 x 100MG  TABS, Take 3 tablets by mouth 2 (two) times daily for 5 days. (Take nirmatrelvir 150 mg two tablets twice daily for 5 days and ritonavir 100 mg one tablet twice daily for 5 days), Disp: 30 tablet, Rfl: 0   omeprazole (PRILOSEC) 40 MG capsule, TAKE 1 CAPSULE BY MOUTH DAILY, Disp: 90 capsule, Rfl: 0   predniSONE (DELTASONE) 20 MG tablet, Take 2 tablets (40 mg total) by mouth daily., Disp: 10 tablet, Rfl: 0   triamcinolone cream (KENALOG) 0.1 %, Apply to affected area 1-2 times daily, Disp: 30 g, Rfl: 0   levocetirizine (XYZAL) 5 MG tablet, Take 1 tablet (5 mg total) by mouth every evening. (Patient not taking: Reported on 07/14/2022), Disp: 90 tablet, Rfl: 3   montelukast (SINGULAIR) 10 MG tablet, Take 1 tablet (10 mg total) by mouth at bedtime. (Patient not taking: Reported on 07/14/2022), Disp: 90 tablet, Rfl: 3  Allergies  Allergen Reactions   Pollen Extract Cough and Shortness Of Breath    Objective:   VITALS: Per patient if applicable, see vitals. GENERAL: Alert, appears well and in no acute distress. HEENT: Atraumatic, conjunctiva clear, no obvious abnormalities on inspection of external nose and ears. NECK: Normal movements of the head and neck. CARDIOPULMONARY: No increased WOB. Speaking in clear sentences. I:E ratio WNL.  MS: Moves all visible  extremities without noticeable abnormality. PSYCH: Pleasant and cooperative, well-groomed. Speech normal rate and rhythm. Affect is appropriate. Insight and judgement are appropriate. Attention is focused, linear, and appropriate.  NEURO: CN grossly intact. Oriented as arrived to appointment on time with no prompting. Moves both UE equally.  SKIN: No obvious lesions, wounds, erythema, or cyanosis noted on face or  hands.  Assessment and Plan:   [name] was seen today for covid positive.  Diagnoses and all orders for this visit:  COVID-19  Mild persistent asthma without complication  Other orders -     nirmatrelvir/ritonavir EUA (PAXLOVID) 20 x 150 MG & 10 x 100MG  TABS; Take 3 tablets by mouth 2 (two) times daily for 5 days. (Take nirmatrelvir 150 mg two tablets twice daily for 5 days and ritonavir 100 mg one tablet twice daily for 5 days) -     predniSONE (DELTASONE) 20 MG tablet; Take 2 tablets (40 mg total) by mouth daily.   No red flags on discussion, patient is not in any obvious distress during our visit. Discussed progression of most viral illnesses, and recommended supportive care at this point in time.  Due to asthmatic hx will start prednisone 20 mg BID x 5 days Discussed Paxlovid, she is agreeable -- side effects discussed Discussed over the counter supportive care options, including Tylenol 500 mg q 8 hours, with recommendations to push fluids and rest. Reviewed return precautions including new/worsening fever, SOB, new/worsening cough, sudden onset changes of symptoms. Recommended need to self-quarantine and practice social distancing until symptoms resolve. I recommend that patient follow-up if symptoms worsen or persist despite treatment x 7-10 days, sooner if needed.  I discussed the assessment and treatment plan with the patient. The patient was provided an opportunity to ask questions and all were answered. The patient agreed with the plan and demonstrated an understanding of the instructions.   The patient was advised to call back or seek an in-person evaluation if the symptoms worsen or if the condition fails to improve as anticipated.    [place], [place] 07/14/2022

## 2022-08-02 ENCOUNTER — Ambulatory Visit (INDEPENDENT_AMBULATORY_CARE_PROVIDER_SITE_OTHER): Payer: BC Managed Care – PPO

## 2022-08-02 DIAGNOSIS — J309 Allergic rhinitis, unspecified: Secondary | ICD-10-CM

## 2022-08-11 ENCOUNTER — Ambulatory Visit (INDEPENDENT_AMBULATORY_CARE_PROVIDER_SITE_OTHER): Payer: BC Managed Care – PPO

## 2022-08-11 DIAGNOSIS — J309 Allergic rhinitis, unspecified: Secondary | ICD-10-CM | POA: Diagnosis not present

## 2022-08-23 ENCOUNTER — Ambulatory Visit (INDEPENDENT_AMBULATORY_CARE_PROVIDER_SITE_OTHER): Payer: BC Managed Care – PPO

## 2022-08-23 DIAGNOSIS — J309 Allergic rhinitis, unspecified: Secondary | ICD-10-CM

## 2022-09-06 ENCOUNTER — Ambulatory Visit (INDEPENDENT_AMBULATORY_CARE_PROVIDER_SITE_OTHER): Payer: BC Managed Care – PPO

## 2022-09-06 DIAGNOSIS — J309 Allergic rhinitis, unspecified: Secondary | ICD-10-CM

## 2022-09-13 ENCOUNTER — Ambulatory Visit (INDEPENDENT_AMBULATORY_CARE_PROVIDER_SITE_OTHER): Payer: BC Managed Care – PPO | Admitting: Family Medicine

## 2022-09-13 ENCOUNTER — Other Ambulatory Visit: Payer: Self-pay

## 2022-09-13 ENCOUNTER — Encounter: Payer: Self-pay | Admitting: Family Medicine

## 2022-09-13 VITALS — BP 138/88 | HR 90 | Temp 97.6°F | Resp 18 | Ht 67.0 in | Wt 263.2 lb

## 2022-09-13 DIAGNOSIS — T63481D Toxic effect of venom of other arthropod, accidental (unintentional), subsequent encounter: Secondary | ICD-10-CM

## 2022-09-13 DIAGNOSIS — J302 Other seasonal allergic rhinitis: Secondary | ICD-10-CM

## 2022-09-13 DIAGNOSIS — K219 Gastro-esophageal reflux disease without esophagitis: Secondary | ICD-10-CM

## 2022-09-13 DIAGNOSIS — J309 Allergic rhinitis, unspecified: Secondary | ICD-10-CM

## 2022-09-13 DIAGNOSIS — J4530 Mild persistent asthma, uncomplicated: Secondary | ICD-10-CM | POA: Diagnosis not present

## 2022-09-13 NOTE — Progress Notes (Signed)
[address] [place] [address] Dept: [phone]  FOLLOW UP NOTE  Patient ID: Kristina Garza, female    DOB: 15-Mar-1976  Age: 46 y.o. MRN: [id number] Date of Office Visit: 09/13/2022  Assessment  Chief Complaint: Asthma (6 mth f/u - COVID beginning of October - feeling better; Patient states even when she had COVID she strongly felt like she had the tools to effectively control her asthma!!!!!), Seasonal and Perennial allergic Rhinitis (6 mth f/u - Pretty good), Gastroesophageal Reflux (6 mth f/u - Almost Gone - staying away from eating pizza at night!!!), and Insect Sting (6 mth f/u - No Issues)  HPI Kristina Garza is a 46 year old female who presents to the clinic for follow-up visit.  She was last seen in this clinic on 03/15/2022 by Nehemiah Settle, FNP, for evaluation of asthma, allergic rhinitis on allergen immunotherapy, fire ant allergy, and reflux. In the interim, she had COVID in October of this year with a lot of respiratory symptoms which she reports are beginning to resolve at this time. At today's visit, she reports that her asthma has been moderately well controlled with symptoms including intermittent shortness of breath with activity, cough producing clear mucus with activity and wheeze occurring when she is laughing. She continues Flovent 110-2 puffs twice a day with a spacer and uses albuterol about 1 time a week with relief of symptoms. She stopped montelukast over 6 months ago. Allergic rhinitis is reported as moderately well controlled with symptoms flaring during COVID and beginning to resolve at this time. She reports occasional nasal congestion, sneezing when looking at the sun, and copious post nasal drainage with frequent throat clearing. She rarely needs to use Flonase and has not needed an antihistamine over the last several weeks. She began allergen immunotherapy directed toward pollens, molds, dust mites, pest, and cockroach on 07/06/2021. She continues  allergen immunotherapy with no large or local reactions. She reports a significant decrease in her symptoms of allergic rhinitis while continuing on allergen immunotherapy. Reflux is reported as well controlled with dietary modifications. She is not currently taking a medication to control reflux. She continues to avoid fire ants with no exposures since her last visit to this clinic. Her current medications are listed in the chart.   Drug Allergies:  Allergies  Allergen Reactions   Pollen Extract Cough and Shortness Of Breath    Physical Exam: BP 138/88   Pulse 90   Temp 97.6 F (36.4 C)   Resp 18   Ht 5\' 7"  (1.702 m)   Wt 263 lb 3.2 oz (119.4 kg)   SpO2 97%   BMI 41.22 kg/m    Physical Exam Vitals reviewed.  Constitutional:      Appearance: Normal appearance.  HENT:     Head: Normocephalic and atraumatic.     Right Ear: Tympanic membrane normal.     Left Ear: Tympanic membrane normal.     Nose:     Comments: Bilateral nares slightly erythematous with clear nasal drainage noted. Pharynx normal. Ears normal. Eyes normal.    Mouth/Throat:     Pharynx: Oropharynx is clear.  Eyes:     Conjunctiva/sclera: Conjunctivae normal.  Cardiovascular:     Rate and Rhythm: Normal rate and regular rhythm.     Heart sounds: Normal heart sounds. No murmur heard. Pulmonary:     Effort: Pulmonary effort is normal.     Breath sounds: Normal breath sounds.     Comments: Lungs clear to auscultation Musculoskeletal:  General: Normal range of motion.     Cervical back: Normal range of motion and neck supple.  Skin:    General: Skin is warm and dry.  Neurological:     Mental Status: She is alert and oriented to person, place, and time.  Psychiatric:        Mood and Affect: Mood normal.        Behavior: Behavior normal.        Thought Content: Thought content normal.        Judgment: Judgment normal.     Diagnostics: FVC 3.05, FEV1 2.54.  Predicted FVC 3.93, predicted FEV1 3.14.   Spirometry indicates possible restriction.  No need for daily eating and  Assessment and Plan: 1. Mild persistent asthma, uncomplicated   2. Seasonal and perennial allergic rhinitis   3. Gastroesophageal reflux disease, unspecified whether esophagitis present   4. Insect sting allergy, current reaction, accidental or unintentional, subsequent encounter     Patient Instructions  Asthma Continue Flovent 110-2 puffs twice a day with a spacer to prevent cough or wheeze Continue montelukast 10 mg once a day to prevent cough or wheeze Continue albuterol 2 puffs once every 4 hours as needed for cough or wheeze You may use albuterol 2 puffs 5 to 15 minutes before activity to decrease cough or wheeze  Allergic rhinitis Continue allergen avoidance measures directed toward grass pollen, weed pollen, ragweed pollen, tree pollen, mold, dust mite, cat, dog, and cockroach as listed below Continue allergen immunotherapy and have a set of EpiPen's per injection protocol Continue Flonase 2 sprays in each nostril once a day as needed for stuffy nose Consider saline nasal rinses as needed for nasal symptoms. 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  In case of an allergic reaction, take Benadryl 50 mg every 4 hours, and if life-threatening symptoms occur, inject with EpiPen 0.3 mg.  Reflux Continue dietary and lifestyle modifications as listed below  Call the clinic if this treatment plan is not working well for you.  Follow up in 6 months or sooner if needed.   Return in about 6 months (around 03/15/2023), or if symptoms worsen or fail to improve.    Thank you for the opportunity to care for this patient.  Please do not hesitate to contact me with questions.  Thermon Leyland, FNP [facility]

## 2022-09-13 NOTE — Patient Instructions (Signed)
Asthma Continue Flovent 110-2 puffs twice a day with a spacer to prevent cough or wheeze Continue montelukast 10 mg once a day to prevent cough or wheeze Continue albuterol 2 puffs once every 4 hours as needed for cough or wheeze You may use albuterol 2 puffs 5 to 15 minutes before activity to decrease cough or wheeze  Allergic rhinitis Continue allergen avoidance measures directed toward grass pollen, weed pollen, ragweed pollen, tree pollen, mold, dust mite, cat, dog, and cockroach as listed below Continue allergen immunotherapy and have a set of EpiPen's per injection protocol Continue Flonase 2 sprays in each nostril once a day as needed for stuffy nose Consider saline nasal rinses as needed for nasal symptoms. 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  In case of an allergic reaction, take Benadryl 50 mg every 4 hours, and if life-threatening symptoms occur, inject with EpiPen 0.3 mg.  Reflux Continue dietary and lifestyle modifications as listed below  Call the clinic if this treatment plan is not working well for you.  Follow up in 6 months or sooner if needed.  Reducing Pollen Exposure The American Academy of Allergy, Asthma and Immunology suggests the following steps to reduce your exposure to pollen during allergy seasons. Do not hang sheets or clothing out to dry; pollen may collect on these items. Do not mow lawns or spend time around freshly cut grass; mowing stirs up pollen. Keep windows closed at night.  Keep car windows closed while driving. Minimize morning activities outdoors, a time when pollen counts are usually at their highest. Stay indoors as much as possible when pollen counts or humidity is high and on windy days when pollen tends to remain in the air longer. Use air conditioning when possible.  Many air conditioners have filters that trap the pollen  spores. Use a HEPA room air filter to remove pollen form the indoor air you breathe.  Control of Mold Allergen Mold and fungi can grow on a variety of surfaces provided certain temperature and moisture conditions exist.  Outdoor molds grow on plants, decaying vegetation and soil.  The major outdoor mold, Alternaria and Cladosporium, are found in very high numbers during hot and dry conditions.  Generally, a late Summer - Fall peak is seen for common outdoor fungal spores.  Rain will temporarily lower outdoor mold spore count, but counts rise rapidly when the rainy period ends.  The most important indoor molds are Aspergillus and Penicillium.  Dark, humid and poorly ventilated basements are ideal sites for mold growth.  The next most common sites of mold growth are the bathroom and the kitchen.  Outdoor [employer] Use air conditioning and keep windows closed Avoid exposure to decaying vegetation. Avoid leaf raking. Avoid grain handling. Consider wearing a face mask if working in moldy areas.  Indoor Mold Control Maintain humidity below 50%. Clean washable surfaces with 5% bleach solution. Remove sources e.g. Contaminated carpets.   Control of Dust Mite Allergen Dust mites play a major role in allergic asthma and rhinitis. They occur in environments with high humidity wherever human skin is found. Dust mites absorb humidity from the atmosphere (ie, they do not drink) and feed on organic matter (including shed human and animal skin). Dust mites are a microscopic type of insect that you cannot see with the naked eye. High levels of dust mites have been detected from mattresses, pillows,  carpets, upholstered furniture, bed covers, clothes, soft toys and any woven material. The principal allergen of the dust mite is found in its feces. A gram of dust may contain 1,000 mites and 250,000 fecal particles. Mite antigen is easily measured in the air during house cleaning activities. Dust mites do not bite  and do not cause harm to humans, other than by triggering allergies/asthma.  Ways to decrease your exposure to dust mites in your home:  1. Encase mattresses, box springs and pillows with a mite-impermeable barrier or cover  2. Wash sheets, blankets and drapes weekly in hot water (130 F) with detergent and dry them in a dryer on the hot setting.  3. Have the room cleaned frequently with a vacuum cleaner and a damp dust-mop. For carpeting or rugs, vacuuming with a vacuum cleaner equipped with a high-efficiency particulate air (HEPA) filter. The dust mite allergic individual should not be in a room which is being cleaned and should wait 1 hour after cleaning before going into the room.  4. Do not sleep on upholstered furniture (eg, couches).  5. If possible removing carpeting, upholstered furniture and drapery from the home is ideal. Horizontal blinds should be eliminated in the rooms where the person spends the most time (bedroom, study, television room). Washable vinyl, roller-type shades are optimal.  6. Remove all non-washable stuffed toys from the bedroom. Wash stuffed toys weekly like sheets and blankets above.  7. Reduce indoor humidity to less than 50%. Inexpensive humidity monitors can be purchased at most hardware stores. Do not use a humidifier as can make the problem worse and are not recommended.  Control of Dog or Cat Allergen Avoidance is the best way to manage a dog or cat allergy. If you have a dog or cat and are allergic to dog or cats, consider removing the dog or cat from the home. If you have a dog or cat but don't want to find it a new home, or if your family wants a pet even though someone in the household is allergic, here are some strategies that may help keep symptoms at bay:  Keep the pet out of your bedroom and restrict it to only a few rooms. Be advised that keeping the dog or cat in only one room will not limit the allergens to that room. Don't pet, hug or kiss the  dog or cat; if you do, wash your hands with soap and water. High-efficiency particulate air (HEPA) cleaners run continuously in a bedroom or living room can reduce allergen levels over time. Regular use of a high-efficiency vacuum cleaner or a central vacuum can reduce allergen levels. Giving your dog or cat a bath at least once a week can reduce airborne allergen.  Control of Cockroach Allergen Cockroach allergen has been identified as an important cause of acute attacks of asthma, especially in urban settings.  There are fifty-five species of cockroach that exist in the [place], however only three, the [place], [place] species produce allergen that can affect patients with Asthma.  Allergens can be obtained from fecal particles, egg casings and secretions from cockroaches.    Remove food sources. Reduce access to water. Seal access and entry points. Spray runways with 0.5-1% Diazinon or Chlorpyrifos Blow boric acid power under stoves and refrigerator. Place bait stations (hydramethylnon) at feeding sites.

## 2022-09-20 ENCOUNTER — Ambulatory Visit (INDEPENDENT_AMBULATORY_CARE_PROVIDER_SITE_OTHER): Payer: BC Managed Care – PPO

## 2022-09-20 DIAGNOSIS — J309 Allergic rhinitis, unspecified: Secondary | ICD-10-CM | POA: Diagnosis not present

## 2022-09-21 ENCOUNTER — Encounter: Payer: Self-pay | Admitting: *Deleted

## 2022-09-29 ENCOUNTER — Ambulatory Visit (INDEPENDENT_AMBULATORY_CARE_PROVIDER_SITE_OTHER): Payer: BC Managed Care – PPO

## 2022-09-29 DIAGNOSIS — J309 Allergic rhinitis, unspecified: Secondary | ICD-10-CM | POA: Diagnosis not present

## 2022-10-04 ENCOUNTER — Ambulatory Visit (INDEPENDENT_AMBULATORY_CARE_PROVIDER_SITE_OTHER): Payer: BC Managed Care – PPO

## 2022-10-04 DIAGNOSIS — J309 Allergic rhinitis, unspecified: Secondary | ICD-10-CM

## 2022-10-11 ENCOUNTER — Ambulatory Visit (INDEPENDENT_AMBULATORY_CARE_PROVIDER_SITE_OTHER): Payer: BC Managed Care – PPO

## 2022-10-11 DIAGNOSIS — J309 Allergic rhinitis, unspecified: Secondary | ICD-10-CM

## 2022-10-20 ENCOUNTER — Ambulatory Visit (INDEPENDENT_AMBULATORY_CARE_PROVIDER_SITE_OTHER): Payer: BC Managed Care – PPO

## 2022-10-20 DIAGNOSIS — J309 Allergic rhinitis, unspecified: Secondary | ICD-10-CM | POA: Diagnosis not present

## 2022-10-22 IMAGING — RF DG ESOPHAGUS
9 of 11 series · 14 of 24 positions shown · non-contrast
Comparison: None.

CLINICAL DATA: Esophageal dysphagia.

EXAM:
ESOPHOGRAM / BARIUM SWALLOW / BARIUM TABLET STUDY
TECHNIQUE: Combined double contrast and single contrast examination performed
using effervescent crystals, thick barium liquid, and thin barium
liquid. The patient was observed with fluoroscopy swallowing a 13 mm
barium sulphate tablet.
FLUOROSCOPY TIME:  Fluoroscopy Time:  2 minutes and 54 seconds
Radiation Exposure Index (if provided by the fluoroscopic device):
63.7 mGy
Number of Acquired Spot Images: 0

[Series 1: cp_standard · 0.34mm/px · 2 of 58 frames shown (1 of 9)]
[frame 9/58]
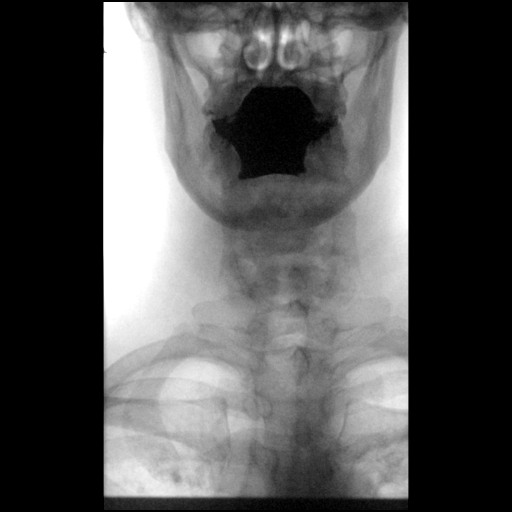
[frame 30/58]
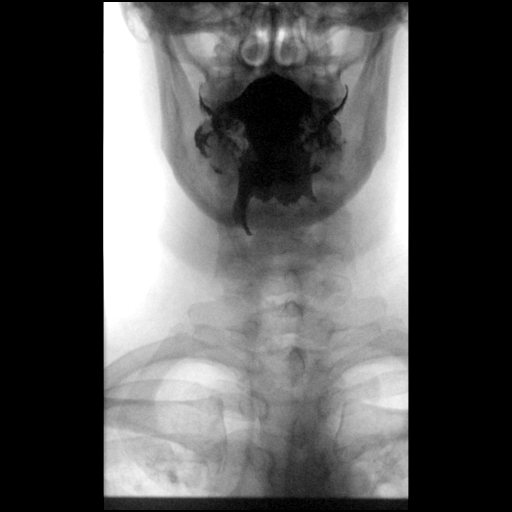

[Series 2: cp_standard · 0.34mm/px · 2 of 58 frames shown (2 of 9)]
[frame 30/58]
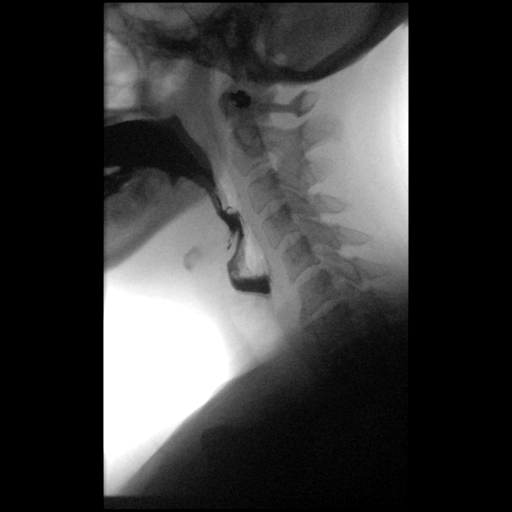
[frame 50/58]
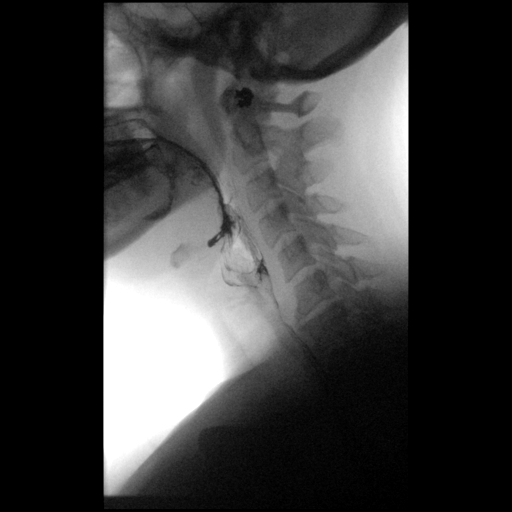

[Series 3: cp_standard · 0.51mm/px · 2 of 92 frames shown (3 of 9)]
[frame 47/92]
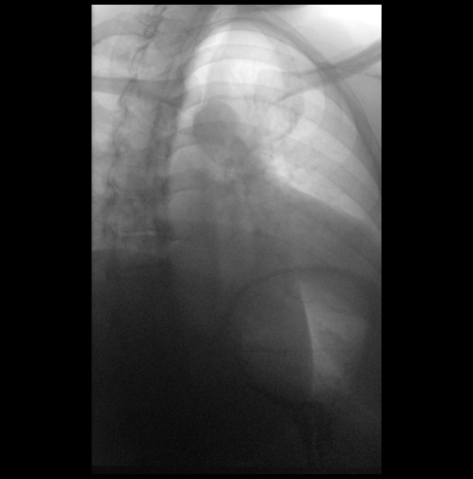
[frame 80/92]
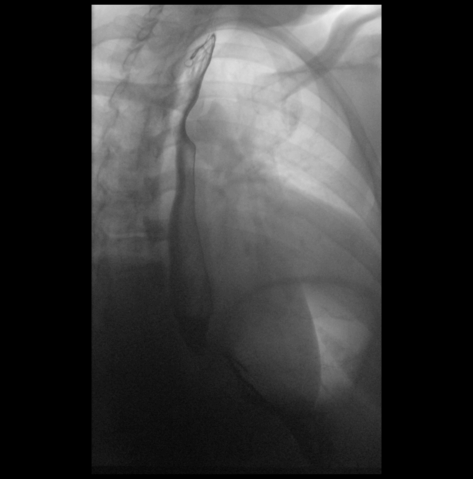

[Series 4: cp_standard · 0.51mm/px · 2 of 72 frames shown (4 of 9)]
[frame 11/72]
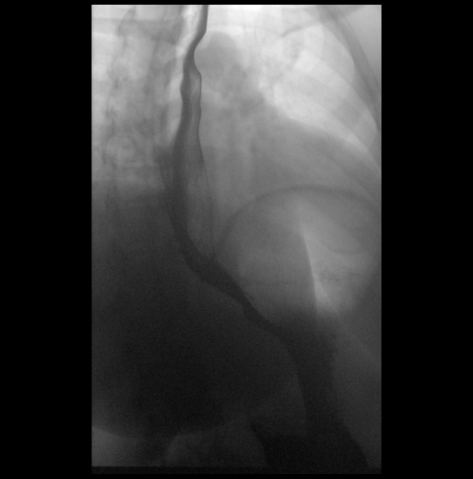
[frame 62/72]
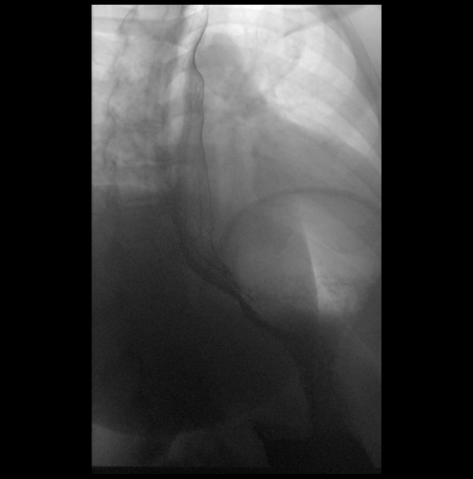

[Series 6: cp_standard · 0.27mm/px · 1 of 1 slices shown (5 of 9)]
[im 1/1]
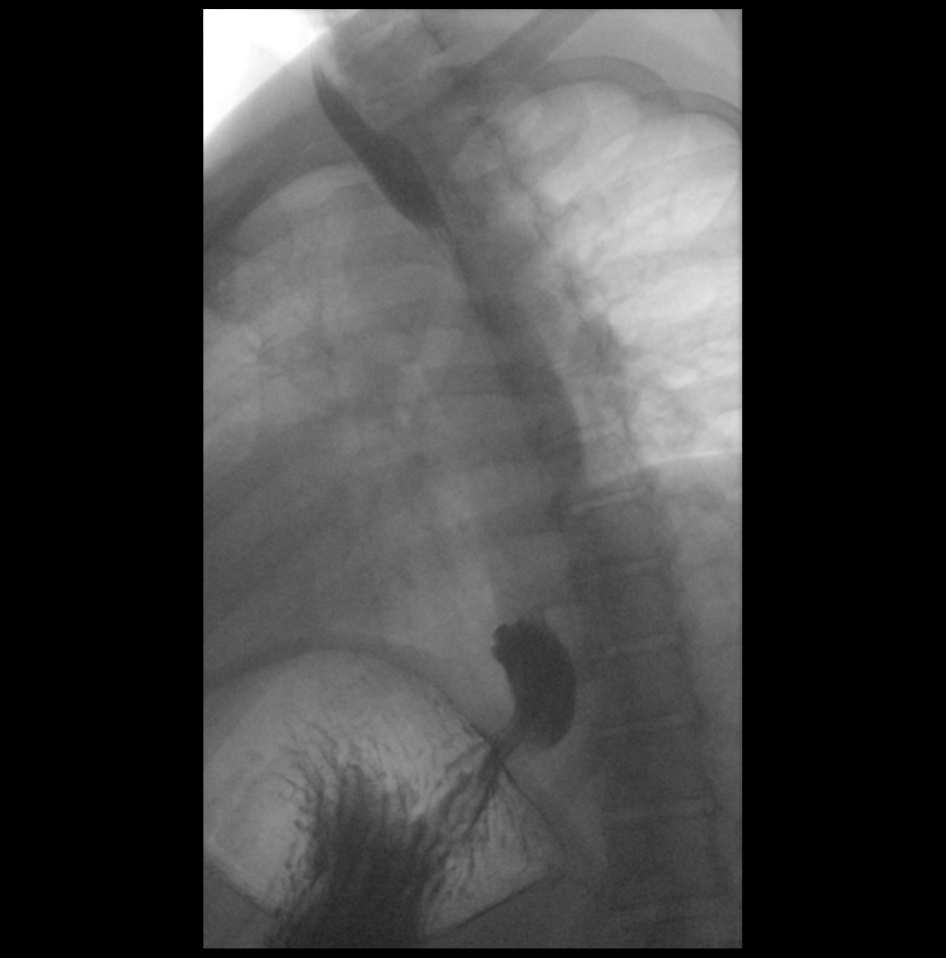

[Series 7: cp_standard · 0.53mm/px · 1 of 174 frames shown (6 of 9)]
[frame 88/174]
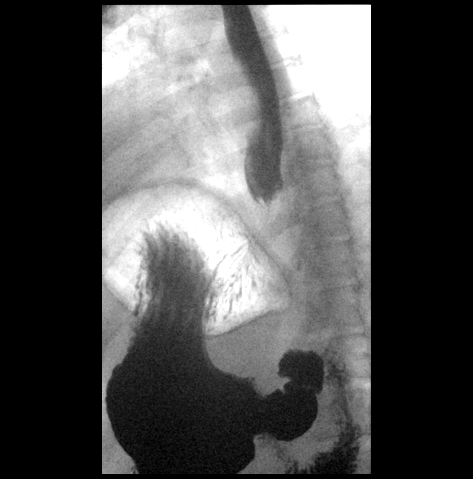

[Series 8: cp_standard · 0.27mm/px · 1 of 1 slices shown (7 of 9)]
[im 1/1]
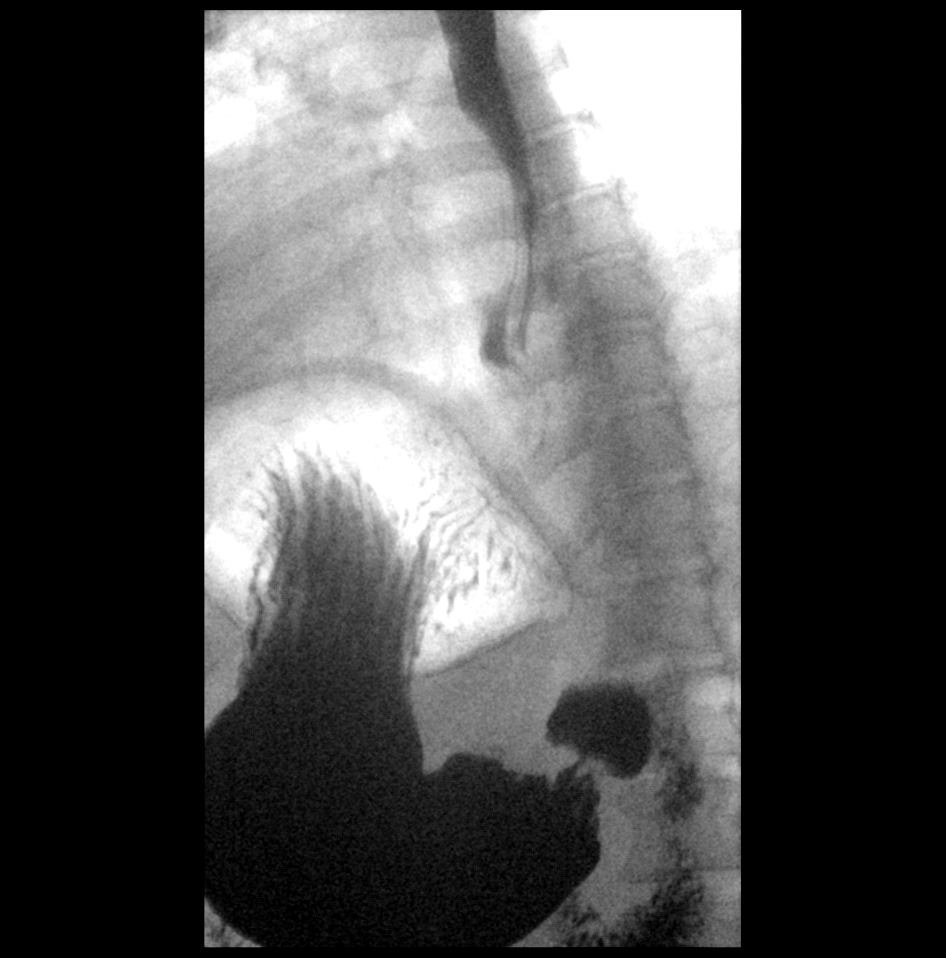

[Series 9: cp_standard · 0.26mm/px · 1 of 1 slices shown (8 of 9)]
[im 1/1]
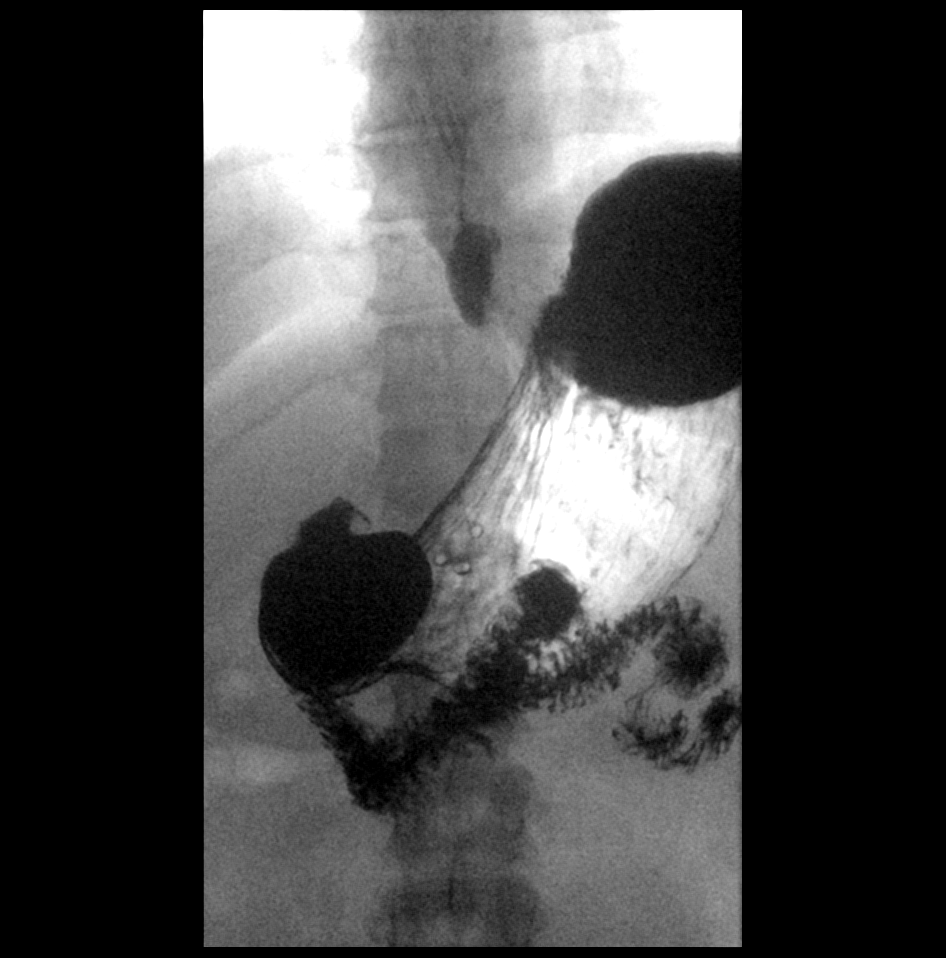

[Series 11: cp_standard · 0.51mm/px · 2 of 73 frames shown (9 of 9)]
[frame 16/73]
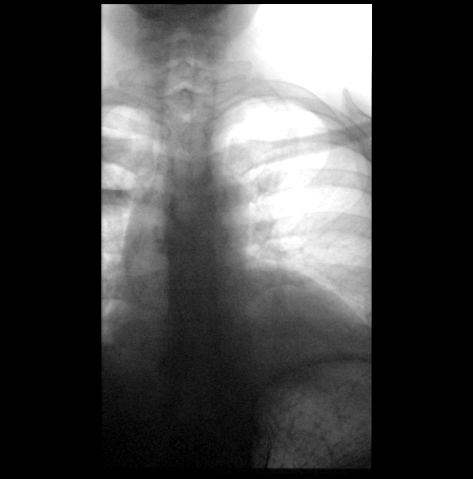
[frame 63/73]
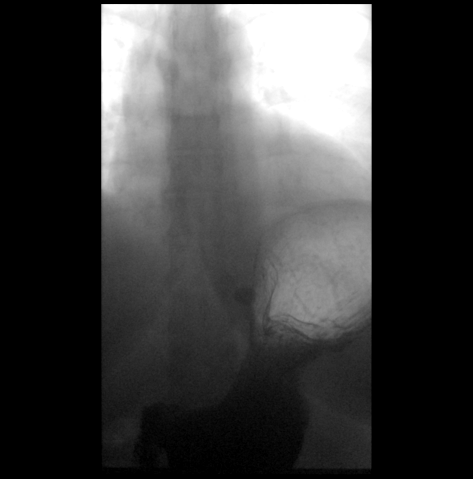

[14 of 24 positions shown; findings below may reference images not displayed]

FINDINGS: Initial barium swallows demonstrate normal pharyngeal motion with
swallowing. No laryngeal penetration or aspiration. No upper
esophageal webs, strictures or diverticuli.

Normal esophageal motility. No intrinsic or extrinsic lesions of the
esophagus were identified.

Small sliding-type hiatal hernia and mild inducible GE reflux with
water swallowing.

The 13 mm barium pill passed into the stomach without difficulty.
IMPRESSION: 1. Normal esophageal motility.
2. Small sliding-type hiatal hernia and mild inducible GE reflux.
3. No mass or stricture.

## 2022-10-27 ENCOUNTER — Ambulatory Visit (INDEPENDENT_AMBULATORY_CARE_PROVIDER_SITE_OTHER): Payer: BC Managed Care – PPO

## 2022-10-27 DIAGNOSIS — J309 Allergic rhinitis, unspecified: Secondary | ICD-10-CM

## 2022-10-30 ENCOUNTER — Telehealth: Payer: Self-pay

## 2022-10-30 ENCOUNTER — Other Ambulatory Visit: Payer: Self-pay

## 2022-10-30 NOTE — Telephone Encounter (Signed)
Pts insurance no longer covering Flovent but prefers Asmanex hfa please advise to change in therapy. Let pt know about change and confirm pharmacy before sending

## 2022-10-31 MED ORDER — ASMANEX HFA 100 MCG/ACT IN AERO: INHALATION_SPRAY | RESPIRATORY_TRACT | 2 refills | Status: DC

## 2022-10-31 NOTE — Telephone Encounter (Signed)
Please send in Asmanex 100 mcg 2 puffs twice a day.

## 2022-10-31 NOTE — Telephone Encounter (Addendum)
Called patient - DOB/Pharmacy verified - advised of change In inhalers - from Flovent to Asmanex.  Patient will bring in prescription card at her appointment so a copy can be scanned into her chart.   Patient verbalized understanding, no questions.

## 2022-10-31 NOTE — Addendum Note (Signed)
Addended by: Tommas Olp B on: 10/31/2022 10:19 AM   Modules accepted: Orders

## 2022-11-01 ENCOUNTER — Ambulatory Visit (INDEPENDENT_AMBULATORY_CARE_PROVIDER_SITE_OTHER): Payer: BC Managed Care – PPO

## 2022-11-01 DIAGNOSIS — J309 Allergic rhinitis, unspecified: Secondary | ICD-10-CM

## 2022-11-06 DIAGNOSIS — J3089 Other allergic rhinitis: Secondary | ICD-10-CM | POA: Diagnosis not present

## 2022-11-07 NOTE — Progress Notes (Signed)
VIALS EXP 11-08-23 

## 2022-11-08 ENCOUNTER — Ambulatory Visit (INDEPENDENT_AMBULATORY_CARE_PROVIDER_SITE_OTHER): Payer: BC Managed Care – PPO

## 2022-11-08 DIAGNOSIS — J309 Allergic rhinitis, unspecified: Secondary | ICD-10-CM

## 2022-11-17 ENCOUNTER — Ambulatory Visit (INDEPENDENT_AMBULATORY_CARE_PROVIDER_SITE_OTHER): Payer: BC Managed Care – PPO

## 2022-11-17 DIAGNOSIS — J309 Allergic rhinitis, unspecified: Secondary | ICD-10-CM

## 2022-11-22 ENCOUNTER — Ambulatory Visit (INDEPENDENT_AMBULATORY_CARE_PROVIDER_SITE_OTHER): Payer: BC Managed Care – PPO

## 2022-11-22 DIAGNOSIS — J309 Allergic rhinitis, unspecified: Secondary | ICD-10-CM | POA: Diagnosis not present

## 2022-12-06 ENCOUNTER — Ambulatory Visit (INDEPENDENT_AMBULATORY_CARE_PROVIDER_SITE_OTHER): Payer: BC Managed Care – PPO

## 2022-12-06 DIAGNOSIS — J309 Allergic rhinitis, unspecified: Secondary | ICD-10-CM | POA: Diagnosis not present

## 2022-12-15 ENCOUNTER — Ambulatory Visit (INDEPENDENT_AMBULATORY_CARE_PROVIDER_SITE_OTHER): Payer: BC Managed Care – PPO

## 2022-12-15 DIAGNOSIS — J309 Allergic rhinitis, unspecified: Secondary | ICD-10-CM

## 2022-12-21 ENCOUNTER — Ambulatory Visit (INDEPENDENT_AMBULATORY_CARE_PROVIDER_SITE_OTHER): Payer: BC Managed Care – PPO | Admitting: Physician Assistant

## 2022-12-21 ENCOUNTER — Other Ambulatory Visit: Payer: Self-pay | Admitting: Physician Assistant

## 2022-12-21 ENCOUNTER — Encounter: Payer: Self-pay | Admitting: Physician Assistant

## 2022-12-21 VITALS — BP 144/112 | HR 81 | Temp 97.3°F | Resp 16 | Ht 67.0 in | Wt 266.2 lb

## 2022-12-21 DIAGNOSIS — Z1283 Encounter for screening for malignant neoplasm of skin: Secondary | ICD-10-CM

## 2022-12-21 DIAGNOSIS — R030 Elevated blood-pressure reading, without diagnosis of hypertension: Secondary | ICD-10-CM | POA: Diagnosis not present

## 2022-12-21 DIAGNOSIS — M25561 Pain in right knee: Secondary | ICD-10-CM

## 2022-12-21 DIAGNOSIS — Z1231 Encounter for screening mammogram for malignant neoplasm of breast: Secondary | ICD-10-CM

## 2022-12-21 DIAGNOSIS — E669 Obesity, unspecified: Secondary | ICD-10-CM | POA: Diagnosis not present

## 2022-12-21 DIAGNOSIS — Z0001 Encounter for general adult medical examination with abnormal findings: Secondary | ICD-10-CM

## 2022-12-21 DIAGNOSIS — M722 Plantar fascial fibromatosis: Secondary | ICD-10-CM

## 2022-12-21 DIAGNOSIS — Z1211 Encounter for screening for malignant neoplasm of colon: Secondary | ICD-10-CM

## 2022-12-21 DIAGNOSIS — R29818 Other symptoms and signs involving the nervous system: Secondary | ICD-10-CM

## 2022-12-21 DIAGNOSIS — G8929 Other chronic pain: Secondary | ICD-10-CM

## 2022-12-21 LAB — HEMOGLOBIN A1C: Hgb A1c MFr Bld: 6 % (ref 4.6–6.5)

## 2022-12-21 LAB — COMPREHENSIVE METABOLIC PANEL
ALT: 14 U/L (ref 0–35)
AST: 12 U/L (ref 0–37)
Albumin: 3.8 g/dL (ref 3.5–5.2)
Alkaline Phosphatase: 71 U/L (ref 39–117)
BUN: 11 mg/dL (ref 6–23)
CO2: 27 mEq/L (ref 19–32)
Calcium: 8.9 mg/dL (ref 8.4–10.5)
Chloride: 102 mEq/L (ref 96–112)
Creatinine, Ser: 0.72 mg/dL (ref 0.40–1.20)
GFR: 99.97 mL/min (ref >60.00)
Glucose, Bld: 108 mg/dL — ABNORMAL HIGH (ref 70–99)
Potassium: 4 mEq/L (ref 3.5–5.1)
Sodium: 137 mEq/L (ref 135–145)
Total Bilirubin: 0.6 mg/dL (ref 0.2–1.2)
Total Protein: 6.8 g/dL (ref 6.0–8.3)

## 2022-12-21 LAB — CBC WITH DIFFERENTIAL/PLATELET
Basophils Absolute: 0 10*3/uL (ref 0.0–0.1)
Basophils Relative: 0.5 % (ref 0.0–3.0)
Eosinophils Absolute: 0.2 10*3/uL (ref 0.0–0.7)
Eosinophils Relative: 3 % (ref 0.0–5.0)
HCT: 40 % (ref 36.0–46.0)
Hemoglobin: 13.6 g/dL (ref 12.0–15.0)
Lymphocytes Relative: 26.1 % (ref 12.0–46.0)
Lymphs Abs: 1.9 10*3/uL (ref 0.7–4.0)
MCHC: 34.1 g/dL (ref 30.0–36.0)
MCV: 90.4 fl (ref 78.0–100.0)
Monocytes Absolute: 0.5 10*3/uL (ref 0.1–1.0)
Monocytes Relative: 7.3 % (ref 3.0–12.0)
Neutro Abs: 4.5 10*3/uL (ref 1.4–7.7)
Neutrophils Relative %: 63.1 % (ref 43.0–77.0)
Platelets: 279 10*3/uL (ref 150.0–400.0)
RBC: 4.43 Mil/uL (ref 3.87–5.11)
RDW: 13.4 % (ref 11.5–15.5)
WBC: 7.1 10*3/uL (ref 4.0–10.5)

## 2022-12-21 LAB — LIPID PANEL
Cholesterol: 145 mg/dL (ref 0–200)
HDL: 36.2 mg/dL — ABNORMAL LOW (ref >39.00)
LDL Cholesterol: 79 mg/dL (ref 0–99)
NonHDL: 109.14
Total CHOL/HDL Ratio: 4
Triglycerides: 150 mg/dL — ABNORMAL HIGH (ref 0.0–149.0)
VLDL: 30 mg/dL (ref 0.0–40.0)

## 2022-12-21 LAB — TSH: TSH: 2.09 u[IU]/mL (ref 0.35–5.50)

## 2022-12-21 NOTE — Patient Instructions (Addendum)
It was great to see you!  Referral to Neurology for sleep study Referral to Sports Medicine for your ankle and knee Referral to Dermatology for your skin checks -- try to be diligent with sunscreen!! Please schedule a mammogram We will order cologuard for your colon cancer screening Please keep an eye on your blood pressure -- check this 3-4 times per week and send me averages via mychart in 2-4 weeks  Please go to the lab for blood work.   Our office will call you with your results unless you have chosen to receive results via MyChart.  If your blood work is normal we will follow-up each year for physicals and as scheduled for chronic medical problems.  If anything is abnormal we will treat accordingly and get you in for a follow-up.  Take care,  Aldona Bar

## 2022-12-21 NOTE — Progress Notes (Signed)
Subjective:    Kristina Garza is a 47 y.o. female and is here for a comprehensive physical exam.  HPI  Health Maintenance Due  Topic Date Due   HIV Screening  Never done   MAMMOGRAM  Never done   Hepatitis C Screening  Never done   COLONOSCOPY (Pts 45-53yr Insurance coverage will need to be confirmed)  Never done   INFLUENZA VACCINE  05/09/2022   COVID-19 Vaccine (4 - 2023-24 season) 06/09/2022    Acute Concerns: Elevated blood pressure Patient reports that she has regularly checked her blood pressure over the past 2 months. She states that she usually receives high home readings but has had some normal readings as well. She denies swelling in ankles/legs, vision change, blurred vision, unusual headaches, chest pain, and excess salt intake.  Chronic Issues: Plantar fasciitis; Knee pain Patient is complaining of increased aggravation of her left foot plantar fasciitis occurring last summer. She states that she was formally diagnosed 8-9 years ago. She reports that she will use different techniques to manage pain which does help to a degree initially, but the pain always returns. Patient mentions that she is not currently in pain, but will most likely be in pain in a few hours when she walks more. 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 She states that her foot pain can be intense enough for her to suspect a fracture towards her heel. Patient confirms that she wears special orthotic shoes. She has never seen a physical therapist for this issue. Patient reports having a right knee surgery 8 years ago that may play a part in her discomfort. She manages her pain with exercise, stretching, and tennis ball massage technique.  Skin Cancer Screening Patient mentions that she has a few new freckles and works frequently in sun. She is requesting a referral to a dermatologist.  Health Maintenance: PAP -- last completed on 08/31/2020. Diet -- She states that she eats plenty of  vegetables and drinks water, but will overeat and make unhealthy choices. She reports that she is trying to make better food choices. Exercise -- She reports that she walks at least a mile everyday with her dog.  Sleep habits -- She states that she does snore and may want a sleep study. Husband has OSA as does dad. She has daytime fatigue. Mood -- She is in a stable mood today.  UTD with dentist? - She is UTD on dental care. UTD with eye doctor? - She is UTD on vision care.  Weight history: Wt Readings from Last 10 Encounters:  12/21/22 266 lb 3.2 oz (120.7 kg)  09/13/22 263 lb 3.2 oz (119.4 kg)  03/15/22 264 lb 2 oz (119.8 kg)  05/27/21 268 lb 6.4 oz (121.7 kg)  10/29/20 269 lb (122 kg)  09/07/20 271 lb 4 oz (123 kg)  08/31/20 268 lb 8 oz (121.8 kg)  07/28/20 273 lb 4 oz (123.9 kg)   Body mass index is 41.69 kg/m. No LMP recorded.  Alcohol use:  reports current alcohol use.  Tobacco use:  Tobacco Use: Low Risk  (12/21/2022)   Patient History    Smoking Tobacco Use: Never    Smokeless Tobacco Use: Never    Passive Exposure: Never   Eligible for lung cancer screening? No     12/21/2022    7:59 AM  Depression screen PHQ 2/9  Decreased Interest 0  Down, Depressed, Hopeless 0  PHQ - 2 Score 0  Altered sleeping 0  Tired, decreased energy 1  Change in appetite 0  Feeling bad or failure about yourself  0  Trouble concentrating 0  Moving slowly or fidgety/restless 0  Suicidal thoughts 0  PHQ-9 Score 1  Difficult doing work/chores Not difficult at all     Other providers/specialists: Patient Care Team: Inda Coke, [place] as PCP - General (Physician Assistant)    PMHx, SurgHx, SocialHx, Medications, and Allergies were reviewed in the Visit Navigator and updated as appropriate.   Past Medical History:  Diagnosis Date   Allergy    Anemia    As a teenager - not recently   Asthma    GERD (gastroesophageal reflux disease)      Past Surgical History:  Procedure  Laterality Date   FOOT SURGERY Left 2007   5th Metatarsal broken pin placed   FRACTURE SURGERY  2006   Left Foot 5th Metatarsal Pin   Knee surgery Right 2016   miniscus removed     Family History  Problem Relation Age of Onset   Asthma Mother    Osteoarthritis Mother    Arthritis Mother    Hyperlipidemia Father    Hypertension Father    Heart attack Father    Sleep apnea Father    Heart disease Father    Prostate cancer Father    Hyperlipidemia Paternal Grandmother    Hypertension Paternal Grandmother    Heart attack Paternal Grandmother    Heart disease Paternal Grandmother    Breast cancer Neg Hx    Colon cancer Neg Hx    Stomach cancer Neg Hx    Esophageal cancer Neg Hx    Pancreatic cancer Neg Hx     Social History   Tobacco Use   Smoking status: Never    Passive exposure: Never   Smokeless tobacco: Never  Vaping Use   Vaping Use: Never used  Substance Use Topics   Alcohol use: Yes    Comment: 1 a month maybe   Drug use: Never    Review of Systems:   Review of Systems  Constitutional:  Negative for chills, fever, malaise/fatigue and weight loss.  HENT:  Negative for hearing loss, sinus pain and sore throat.   Respiratory:  Negative for cough and hemoptysis.   Cardiovascular:  Negative for chest pain, palpitations, leg swelling and PND.  Gastrointestinal:  Negative for abdominal pain, constipation, diarrhea, heartburn, nausea and vomiting.  Genitourinary:  Negative for dysuria, frequency and urgency.  Musculoskeletal:  Negative for back pain, myalgias and neck pain.  Skin:  Negative for itching and rash.  Endo/Heme/Allergies:  Negative for polydipsia.  Psychiatric/Behavioral:  Negative for depression. The patient is not nervous/anxious.     Objective:   BP (!) 144/112   Pulse 81   Temp (!) 97.3 F (36.3 C) (Temporal)   Resp 16   Ht '5\' 7"'$  (1.702 m)   Wt 266 lb 3.2 oz (120.7 kg)   SpO2 99%   BMI 41.69 kg/m  Body mass index is 41.69  kg/m.   General Appearance:    Alert, cooperative, no distress, appears stated age  Head:    Normocephalic, without obvious abnormality, atraumatic  Eyes:    PERRL, conjunctiva/corneas clear, EOM's intact, fundi    benign, both eyes  Ears:    Normal TM's and external ear canals, both ears  Nose:   Nares normal, septum midline, mucosa normal, no drainage    or sinus tenderness  Throat:   Lips, mucosa, and tongue normal; teeth and gums  normal  Neck:   Supple, symmetrical, trachea midline, no adenopathy;    thyroid:  no enlargement/tenderness/nodules; no carotid   bruit or JVD  Back:     Symmetric, no curvature, ROM normal, no CVA tenderness  Lungs:     Clear to auscultation bilaterally, respirations unlabored  Chest Wall:    No tenderness or deformity   Heart:    Regular rate and rhythm, S1 and S2 normal, no murmur, rub or gallop  Breast Exam:    Deferred   Abdomen:     Soft, non-tender, bowel sounds active all four quadrants,    no masses, no organomegaly  Genitalia:    Deferred   Extremities:   Extremities normal, atraumatic, no cyanosis or edema  Pulses:   2+ and symmetric all extremities  Skin:   Skin color, texture, turgor normal, no rashes or lesions  Lymph nodes:   Cervical, supraclavicular, and axillary nodes normal  Neurologic:   CNII-XII intact, normal strength, sensation and reflexes    throughout    Assessment/Plan:   Encounter for general adult medical examination with abnormal findings Today patient counseled on age appropriate routine health concerns for screening and prevention, each reviewed and up to date or declined. Immunizations reviewed and up to date or declined. Labs ordered and reviewed. Risk factors for depression reviewed and negative. Hearing function and visual acuity are intact. ADLs screened and addressed as needed. Functional ability and level of safety reviewed and appropriate. Education, counseling and referrals performed based on assessed risks today.  Patient provided with a copy of personalized plan for preventive services.  Plantar fasciitis, left Referral to sports medicine  Chronic pain of right knee Referral to sports medicine  Elevated blood pressure reading Above goal No evidence of end organ damage She would like to be assessed for OSA and also monitor home BP prior to starting medication Asked patient to monitor home BP consistently and send [place] averages in next 2-4 weeks  Obesity, unspecified classification, unspecified obesity type, unspecified whether serious comorbidity present Work on diet and exercise as able  Suspected sleep apnea Referral placed  Skin cancer screening Referral placed  Special screening for malignant neoplasms, colon Cologuard ordered  I,Verona Buck,acting as a Education administrator for [employer], PA.,have documented all relevant documentation on the behalf of Inda Coke, PA,as directed by  Inda Coke, PA while in the presence of Inda Coke, [place].  I, Inda Coke, [place], have reviewed all documentation for this visit. The documentation on 12/21/22 for the exam, diagnosis, procedures, and orders are all accurate and complete.  Inda Coke, PA-C [place]

## 2022-12-22 ENCOUNTER — Ambulatory Visit (INDEPENDENT_AMBULATORY_CARE_PROVIDER_SITE_OTHER): Payer: BC Managed Care – PPO | Admitting: *Deleted

## 2022-12-22 DIAGNOSIS — J309 Allergic rhinitis, unspecified: Secondary | ICD-10-CM

## 2022-12-27 ENCOUNTER — Ambulatory Visit (INDEPENDENT_AMBULATORY_CARE_PROVIDER_SITE_OTHER): Payer: BC Managed Care – PPO

## 2022-12-27 ENCOUNTER — Ambulatory Visit (INDEPENDENT_AMBULATORY_CARE_PROVIDER_SITE_OTHER): Payer: BC Managed Care – PPO | Admitting: Family Medicine

## 2022-12-27 ENCOUNTER — Other Ambulatory Visit: Payer: Self-pay

## 2022-12-27 ENCOUNTER — Encounter: Payer: Self-pay | Admitting: Family Medicine

## 2022-12-27 VITALS — BP 130/84 | HR 76 | Ht 67.0 in | Wt 266.6 lb

## 2022-12-27 DIAGNOSIS — M722 Plantar fascial fibromatosis: Secondary | ICD-10-CM

## 2022-12-27 DIAGNOSIS — M25561 Pain in right knee: Secondary | ICD-10-CM

## 2022-12-27 DIAGNOSIS — G8929 Other chronic pain: Secondary | ICD-10-CM

## 2022-12-27 DIAGNOSIS — M7732 Calcaneal spur, left foot: Secondary | ICD-10-CM | POA: Diagnosis not present

## 2022-12-27 DIAGNOSIS — M1711 Unilateral primary osteoarthritis, right knee: Secondary | ICD-10-CM

## 2022-12-27 DIAGNOSIS — M79672 Pain in left foot: Secondary | ICD-10-CM

## 2022-12-27 NOTE — Patient Instructions (Addendum)
Thank you for coming in today.   Please complete the exercises that the athletic trainer went over with you:  View at [email or web address] using code: [email or web address]  Please get an Xray today before you leave   I've referred you to Physical Therapy.  Let us know if you don't hear from them in one week.   Heel cushion.  Night Splint for Plantar Fasciitis.   Arch strap   Ice massage.   Heel exercises.   Recheck in 6 weeks.   Let me know if this is not going well.

## 2022-12-27 NOTE — Progress Notes (Signed)
I, Josepha Pigg, CMA acting as a Education administrator for Lynne Leader, MD.  Subjective:    CC: Left foot and right knee pain  HPI: Patient is a 47 year old female presenting with left foot and right knee pain.  Right knee pain ongoing insert year.  Patient locates pain to medial aspect of the knee, flaring up over the past 2 months. Menisectomy 8 years ago. Sx are intermittent, worse when more physically active. Feels tight and painful, especially at night. Some swelling.  R knee swelling: yes Mechanical symptoms: no Aggravates: activity Treatments tried: no treatment modalities  Left foot pain ongoing for several years, dx of PF.  Patient locates pain to the left foot, plantar aspect. Has focused on good footwear. Sx tend to flare-up with more activity.  She notes that the pain tends to worsen during the summertime when her activity level increases.  She is worried that the her pain will worsen this summer as well.  She wants to be proactive as she can.  Aggravates: activity Treatments tried: shoes, tennis ball, roller, stretching   Pertinent review of Systems: No fevers or chills  Relevant historical information: Asthma.  Obesity. History of right knee medial meniscectomy about 8 years ago.  Objective:    Vitals:   12/27/22 1134  BP: 130/84  Pulse: 76  SpO2: 98%   General: Well Developed, well nourished, and in no acute distress.   MSK: Right knee: Normal-appearing Tender palpation medial joint line. Normal motion. Minimal laxity MCL stress test. Negative medial McMurray's test. Intact strength.  Left foot normal motion normal appearing tender palpation plantar fascia.   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  Mild patellofemoral DJD.  No acute fractures are visible.  Left calcaneus: Calcaneal spur plantar and  posterior present.  No acute fractures are visible.  Surgical hardware from fifth metatarsal fracture visible as well.  Await formal radiology review   Impression and Recommendations:    Assessment and Plan: 47 y.o. female with  Left plantar fasciitis.  This is a chronic ongoing issue.  We talked about maximizing conservative management strategies including eccentric exercises cushioning night splints ice massage.  The primary treatment should be the eccentric exercises however.  That is not sufficient we can do shockwave treatments or even injection.  Refer to physical therapy as that could be helpful as well.  Right knee pain.  She has medial compartment DJD.  We talked about options.  Quad strengthening and weight loss will be helpful.  Refer to PT to focus on quad strengthening.  We talked about steroid injection.  She would like to maximize conservative management options before proceeding with steroid injection which I think is totally reasonable.  Plan for PT referral.  She lives in [place] so we will use external PT location there.  Recheck in about 6 weeks.  PDMP not reviewed this encounter. Orders Placed This Encounter  Procedures   [place] LIMITED JOINT SPACE STRUCTURES LOW RIGHT(NO LINKED CHARGES)    Order Specific Question:   Reason for Exam (SYMPTOM  OR DIAGNOSIS REQUIRED)    Answer:   right knee pain    Order Specific Question:   Preferred imaging location?    Answer:   [facility]   DG Knee AP/LAT W/Sunrise Right    Standing Status:   Future    Number of Occurrences:   1  Standing Expiration Date:   01/27/2023    Order Specific Question:   Reason for Exam (SYMPTOM  OR DIAGNOSIS REQUIRED)    Answer:   right knee pain    Order Specific Question:   Preferred imaging location?    Answer:   [facility]    Order Specific Question:   Is patient pregnant?    Answer:   No   DG Os Calcis Left    Standing Status:   Future    Number of  Occurrences:   1    Standing Expiration Date:   12/27/2023    Order Specific Question:   Reason for Exam (SYMPTOM  OR DIAGNOSIS REQUIRED)    Answer:   left foot pain    Order Specific Question:   Preferred imaging location?    Answer:   [facility]    Order Specific Question:   Is patient pregnant?    Answer:   No   Ambulatory referral to Physical Therapy    Referral Priority:   Routine    Referral Type:   Physical Medicine    Referral Reason:   Specialty Services Required    Requested Specialty:   Physical Therapy    Number of Visits Requested:   1   No orders of the defined types were placed in this encounter.   Discussed warning signs or symptoms. Please see discharge instructions. Patient expresses understanding.   The above documentation has been reviewed and is accurate and complete Lynne Leader, M.D.

## 2022-12-29 ENCOUNTER — Ambulatory Visit (INDEPENDENT_AMBULATORY_CARE_PROVIDER_SITE_OTHER): Payer: BC Managed Care – PPO

## 2022-12-29 DIAGNOSIS — J309 Allergic rhinitis, unspecified: Secondary | ICD-10-CM | POA: Diagnosis not present

## 2022-12-29 NOTE — Progress Notes (Signed)
Right knee x-ray shows some arthritis changes.

## 2023-01-03 ENCOUNTER — Ambulatory Visit (INDEPENDENT_AMBULATORY_CARE_PROVIDER_SITE_OTHER): Payer: BC Managed Care – PPO

## 2023-01-03 DIAGNOSIS — J309 Allergic rhinitis, unspecified: Secondary | ICD-10-CM | POA: Diagnosis not present

## 2023-01-03 DIAGNOSIS — M25672 Stiffness of left ankle, not elsewhere classified: Secondary | ICD-10-CM | POA: Diagnosis not present

## 2023-01-03 DIAGNOSIS — M62572 Muscle wasting and atrophy, not elsewhere classified, left ankle and foot: Secondary | ICD-10-CM | POA: Diagnosis not present

## 2023-01-03 DIAGNOSIS — M722 Plantar fascial fibromatosis: Secondary | ICD-10-CM | POA: Diagnosis not present

## 2023-01-03 DIAGNOSIS — M25561 Pain in right knee: Secondary | ICD-10-CM | POA: Diagnosis not present

## 2023-01-08 DIAGNOSIS — M62572 Muscle wasting and atrophy, not elsewhere classified, left ankle and foot: Secondary | ICD-10-CM | POA: Diagnosis not present

## 2023-01-08 DIAGNOSIS — M722 Plantar fascial fibromatosis: Secondary | ICD-10-CM | POA: Diagnosis not present

## 2023-01-08 DIAGNOSIS — M25561 Pain in right knee: Secondary | ICD-10-CM | POA: Diagnosis not present

## 2023-01-08 DIAGNOSIS — M25672 Stiffness of left ankle, not elsewhere classified: Secondary | ICD-10-CM | POA: Diagnosis not present

## 2023-01-08 NOTE — Progress Notes (Signed)
Left heel x-ray shows a little bit of a heel spur.

## 2023-01-10 DIAGNOSIS — M62572 Muscle wasting and atrophy, not elsewhere classified, left ankle and foot: Secondary | ICD-10-CM | POA: Diagnosis not present

## 2023-01-10 DIAGNOSIS — M25561 Pain in right knee: Secondary | ICD-10-CM | POA: Diagnosis not present

## 2023-01-10 DIAGNOSIS — M25672 Stiffness of left ankle, not elsewhere classified: Secondary | ICD-10-CM | POA: Diagnosis not present

## 2023-01-10 DIAGNOSIS — M722 Plantar fascial fibromatosis: Secondary | ICD-10-CM | POA: Diagnosis not present

## 2023-01-12 ENCOUNTER — Ambulatory Visit (INDEPENDENT_AMBULATORY_CARE_PROVIDER_SITE_OTHER): Payer: BC Managed Care – PPO

## 2023-01-12 DIAGNOSIS — J309 Allergic rhinitis, unspecified: Secondary | ICD-10-CM | POA: Diagnosis not present

## 2023-01-15 DIAGNOSIS — M25561 Pain in right knee: Secondary | ICD-10-CM | POA: Diagnosis not present

## 2023-01-15 DIAGNOSIS — M62572 Muscle wasting and atrophy, not elsewhere classified, left ankle and foot: Secondary | ICD-10-CM | POA: Diagnosis not present

## 2023-01-15 DIAGNOSIS — M25672 Stiffness of left ankle, not elsewhere classified: Secondary | ICD-10-CM | POA: Diagnosis not present

## 2023-01-15 DIAGNOSIS — M722 Plantar fascial fibromatosis: Secondary | ICD-10-CM | POA: Diagnosis not present

## 2023-01-17 DIAGNOSIS — M722 Plantar fascial fibromatosis: Secondary | ICD-10-CM | POA: Diagnosis not present

## 2023-01-17 DIAGNOSIS — M62572 Muscle wasting and atrophy, not elsewhere classified, left ankle and foot: Secondary | ICD-10-CM | POA: Diagnosis not present

## 2023-01-17 DIAGNOSIS — M25561 Pain in right knee: Secondary | ICD-10-CM | POA: Diagnosis not present

## 2023-01-17 DIAGNOSIS — J3089 Other allergic rhinitis: Secondary | ICD-10-CM | POA: Diagnosis not present

## 2023-01-17 DIAGNOSIS — M25672 Stiffness of left ankle, not elsewhere classified: Secondary | ICD-10-CM | POA: Diagnosis not present

## 2023-01-17 NOTE — Progress Notes (Signed)
VIALS EXP 01-17-24 

## 2023-01-25 ENCOUNTER — Institutional Professional Consult (permissible substitution): Payer: BC Managed Care – PPO | Admitting: Neurology

## 2023-01-29 DIAGNOSIS — M25672 Stiffness of left ankle, not elsewhere classified: Secondary | ICD-10-CM | POA: Diagnosis not present

## 2023-01-29 DIAGNOSIS — M62572 Muscle wasting and atrophy, not elsewhere classified, left ankle and foot: Secondary | ICD-10-CM | POA: Diagnosis not present

## 2023-01-29 DIAGNOSIS — M25561 Pain in right knee: Secondary | ICD-10-CM | POA: Diagnosis not present

## 2023-01-29 DIAGNOSIS — M722 Plantar fascial fibromatosis: Secondary | ICD-10-CM | POA: Diagnosis not present

## 2023-01-31 DIAGNOSIS — M25672 Stiffness of left ankle, not elsewhere classified: Secondary | ICD-10-CM | POA: Diagnosis not present

## 2023-01-31 DIAGNOSIS — M722 Plantar fascial fibromatosis: Secondary | ICD-10-CM | POA: Diagnosis not present

## 2023-01-31 DIAGNOSIS — M25561 Pain in right knee: Secondary | ICD-10-CM | POA: Diagnosis not present

## 2023-01-31 DIAGNOSIS — M62572 Muscle wasting and atrophy, not elsewhere classified, left ankle and foot: Secondary | ICD-10-CM | POA: Diagnosis not present

## 2023-02-02 ENCOUNTER — Ambulatory Visit (INDEPENDENT_AMBULATORY_CARE_PROVIDER_SITE_OTHER): Payer: BC Managed Care – PPO

## 2023-02-02 DIAGNOSIS — J309 Allergic rhinitis, unspecified: Secondary | ICD-10-CM | POA: Diagnosis not present

## 2023-02-05 DIAGNOSIS — M25672 Stiffness of left ankle, not elsewhere classified: Secondary | ICD-10-CM | POA: Diagnosis not present

## 2023-02-05 DIAGNOSIS — M722 Plantar fascial fibromatosis: Secondary | ICD-10-CM | POA: Diagnosis not present

## 2023-02-05 DIAGNOSIS — M62572 Muscle wasting and atrophy, not elsewhere classified, left ankle and foot: Secondary | ICD-10-CM | POA: Diagnosis not present

## 2023-02-05 DIAGNOSIS — M25561 Pain in right knee: Secondary | ICD-10-CM | POA: Diagnosis not present

## 2023-02-06 ENCOUNTER — Other Ambulatory Visit: Payer: Self-pay | Admitting: Allergy & Immunology

## 2023-02-06 DIAGNOSIS — M25672 Stiffness of left ankle, not elsewhere classified: Secondary | ICD-10-CM | POA: Diagnosis not present

## 2023-02-06 DIAGNOSIS — M722 Plantar fascial fibromatosis: Secondary | ICD-10-CM | POA: Diagnosis not present

## 2023-02-06 DIAGNOSIS — M62572 Muscle wasting and atrophy, not elsewhere classified, left ankle and foot: Secondary | ICD-10-CM | POA: Diagnosis not present

## 2023-02-06 DIAGNOSIS — M25561 Pain in right knee: Secondary | ICD-10-CM | POA: Diagnosis not present

## 2023-02-07 ENCOUNTER — Ambulatory Visit
Admission: RE | Admit: 2023-02-07 | Discharge: 2023-02-07 | Disposition: A | Discharge status: Home / Self Care | Payer: BC Managed Care – PPO | Source: Ambulatory Visit | Attending: Physician Assistant | Admitting: Physician Assistant

## 2023-02-07 ENCOUNTER — Encounter: Payer: Self-pay | Admitting: Family Medicine

## 2023-02-07 ENCOUNTER — Ambulatory Visit (INDEPENDENT_AMBULATORY_CARE_PROVIDER_SITE_OTHER): Payer: BC Managed Care – PPO | Admitting: Family Medicine

## 2023-02-07 VITALS — BP 124/86 | HR 76 | Ht 67.0 in | Wt 270.0 lb

## 2023-02-07 DIAGNOSIS — M25561 Pain in right knee: Secondary | ICD-10-CM

## 2023-02-07 DIAGNOSIS — Z6841 Body Mass Index (BMI) 40.0 and over, adult: Secondary | ICD-10-CM

## 2023-02-07 DIAGNOSIS — M722 Plantar fascial fibromatosis: Secondary | ICD-10-CM | POA: Diagnosis not present

## 2023-02-07 DIAGNOSIS — G8929 Other chronic pain: Secondary | ICD-10-CM

## 2023-02-07 DIAGNOSIS — M1711 Unilateral primary osteoarthritis, right knee: Secondary | ICD-10-CM

## 2023-02-07 DIAGNOSIS — Z1231 Encounter for screening mammogram for malignant neoplasm of breast: Secondary | ICD-10-CM | POA: Diagnosis not present

## 2023-02-07 NOTE — Patient Instructions (Addendum)
Thank you for coming in today.   If you eat 30% of your calories in protein generally appetites will be decreased.   Try to get 100g of protein a day.   Keep working with PT on your knee and foot.   I can do injections whenever.   Let me know.

## 2023-02-07 NOTE — Progress Notes (Signed)
   I, Stevenson Clinch, CMA acting as a scribe for Clementeen Graham, MD.  Kristina Garza is a 47 y.o. female who presents to [facility] today for f/u R knee and L foot pain. Pt was last seen by Dr. Denyse Amass on 12/27/22 and was advised to use night splint, ice massage, work on weight loss, and was referred to [facility] in [place]. Today, pt reports that knee sx have been well managed, some swelling today after PT. PT has been going well. Left foot sx have improved significantly. More pain-free days than days with pain.   Dx imaging: 12/27/22 L Os calcis & R knee XR  Pertinent review of systems: No fevers or chills  Relevant historical information: Obesity   Exam:  BP 124/86   Pulse 76   Ht 5\' 7"  (1.702 m)   Wt 270 lb (122.5 kg)   SpO2 96%   BMI 42.29 kg/m  General: Well Developed, well nourished, and in no acute distress.   MSK: Right knee normal motion.  Palpation minimally medial joint line. Some laxity MCL stress test.    Lab and Radiology Results  DG Os Calcis Left  Result Date: 12/30/2022 CLINICAL DATA:  Pain EXAM: LEFT OS CALCIS - 2 VIEW COMPARISON:  None Available. FINDINGS: There is no evidence of fracture or other focal bone lesions. Soft tissues are unremarkable. Posterior and plantar calcaneal spurs are noted. Partially imaged hardware from lateral tarsal fusion. IMPRESSION: Calcaneal spurs.  No acute osseous abnormalities. Electronically Signed   By: Layla Maw M.D.   On: 12/30/2022 00:07   DG Knee AP/LAT W/Sunrise Right  Result Date: 12/28/2022 CLINICAL DATA:  Right knee pain EXAM: RIGHT KNEE 3 VIEWS COMPARISON:  None Available. FINDINGS: Enthesopathic changes off the anterior patella. Mild degenerative change in the medial compartment. Minimal degenerative change in the lateral and patellofemoral compartments. No joint effusion. No other abnormalities. IMPRESSION: Degenerative changes as above. Electronically Signed   By: Gerome Sam  III M.D.   On: 12/28/2022 09:39   I, Clementeen Graham, personally (independently) visualized and performed the interpretation of the images attached in this note.     Assessment and Plan: 47 y.o. female with right knee pain due to medial compartment DJD and left foot pain due to Planter fasciitis.  Both issues improving with physical therapy.  Plan to continue PT and from there proceed to home exercise program.  We discussed obesity management weight loss.  Recommend focusing on calorie goals.  Recommend 20-30% of her calories from protein in about 1800 -calorie/day goal.  If she gets 100 g of protein a day that should help quite a bit.      Discussed warning signs or symptoms. Please see discharge instructions. Patient expresses understanding.   The above documentation has been reviewed and is accurate and complete Clementeen Graham, M.D.

## 2023-02-09 ENCOUNTER — Other Ambulatory Visit: Payer: Self-pay | Admitting: Physician Assistant

## 2023-02-09 DIAGNOSIS — R928 Other abnormal and inconclusive findings on diagnostic imaging of breast: Secondary | ICD-10-CM

## 2023-02-12 DIAGNOSIS — M722 Plantar fascial fibromatosis: Secondary | ICD-10-CM | POA: Diagnosis not present

## 2023-02-12 DIAGNOSIS — M25672 Stiffness of left ankle, not elsewhere classified: Secondary | ICD-10-CM | POA: Diagnosis not present

## 2023-02-12 DIAGNOSIS — M62572 Muscle wasting and atrophy, not elsewhere classified, left ankle and foot: Secondary | ICD-10-CM | POA: Diagnosis not present

## 2023-02-12 DIAGNOSIS — M25561 Pain in right knee: Secondary | ICD-10-CM | POA: Diagnosis not present

## 2023-02-13 ENCOUNTER — Encounter: Payer: Self-pay | Admitting: Neurology

## 2023-02-13 ENCOUNTER — Ambulatory Visit (INDEPENDENT_AMBULATORY_CARE_PROVIDER_SITE_OTHER): Payer: BC Managed Care – PPO | Admitting: Neurology

## 2023-02-13 VITALS — BP 139/85 | HR 78 | Ht 67.0 in | Wt 272.0 lb

## 2023-02-13 DIAGNOSIS — G4719 Other hypersomnia: Secondary | ICD-10-CM

## 2023-02-13 DIAGNOSIS — Z9189 Other specified personal risk factors, not elsewhere classified: Secondary | ICD-10-CM

## 2023-02-13 DIAGNOSIS — R0683 Snoring: Secondary | ICD-10-CM

## 2023-02-13 DIAGNOSIS — R030 Elevated blood-pressure reading, without diagnosis of hypertension: Secondary | ICD-10-CM

## 2023-02-13 DIAGNOSIS — Z820 Family history of epilepsy and other diseases of the nervous system: Secondary | ICD-10-CM

## 2023-02-13 NOTE — Patient Instructions (Signed)

## 2023-02-13 NOTE — Progress Notes (Signed)
Subjective:    Patient ID: Kristina Garza is a 47 y.o. female.  HPI    Huston Foley, MD, PhD [facility] [address] [address] [place], [place] [address]   Dear [name],  I saw your patient, Kristina Garza, upon your kind request in my sleep clinic today for initial consultation of her sleep disorder, in particular, concern for underlying obstructive sleep apnea.  The patient is unaccompanied today.  As you know, [name] is a 47 year old female with an underlying medical history of allergies, anemia, asthma, reflux disease, plantar fasciitis, knee pain, and severe obesity with a BMI of over 40, who reports snoring and excessive daytime somnolence.  She has had elevated blood pressure values.  Her Epworth sleepiness score is 13 out of 24, fatigue severity score is 38 out of 63.  I reviewed your office note from 12/21/2022.  She has had some sleep disruption, she is familiar with sleep apnea diagnosis as her dad has sleep apnea and has a CPAP machine and her husband also has a sleep apnea machine.  She lives with her husband, she has no children, 3 pets inside the home, 2 dogs and 1 cat and 1 outside cat as well.  The cats typically sleep in the bed with them.  She has a bedtime between 11 and midnight and rise time typically between 6:30 AM and 7 AM.  She works from home, [employer].  She does not drink caffeine daily.  She does report nocturia once on an average night, denies recurrent morning headaches.  She has had some recent weight loss, has stayed fairly stable with her weight otherwise.  She is a non-smoker and drinks alcohol infrequently, maybe once a month.  Her Past Medical History Is Significant For: Past Medical History:  Diagnosis Date   Allergy    Anemia    As a teenager - not recently   Asthma    GERD (gastroesophageal reflux disease)    Plantar fasciitis     Her Past Surgical History Is Significant For: Past Surgical History:   Procedure Laterality Date   FOOT SURGERY Left 2007   5th Metatarsal broken pin placed   FRACTURE SURGERY  2006   Left Foot 5th Metatarsal Pin   Knee surgery Right 2016   miniscus removed    Her Family History Is Significant For: Family History  Problem Relation Age of Onset   Asthma Mother    Osteoarthritis Mother    Arthritis Mother    Hyperlipidemia Father    Hypertension Father    Heart attack Father    Sleep apnea Father    Heart disease Father    Prostate cancer Father    Hyperlipidemia Paternal Grandmother    Hypertension Paternal Grandmother    Heart attack Paternal Grandmother    Heart disease Paternal Grandmother    Breast cancer Neg Hx    Colon cancer Neg Hx    Stomach cancer Neg Hx    Esophageal cancer Neg Hx    Pancreatic cancer Neg Hx     Her Social History Is Significant For: Social History   Socioeconomic History   Marital status: Married    Spouse name: Not on file   Number of children: Not on file   Years of education: Not on file   Highest education level: Not on file  Occupational History   Not on file  Tobacco Use   Smoking status: Never    Passive exposure: Never  Smokeless tobacco: Never  Vaping Use   Vaping Use: Never used  Substance and Sexual Activity   Alcohol use: Yes    Comment: 1 a month maybe   Drug use: Never   Sexual activity: Yes    Birth control/protection: None  Other Topics Concern   Not on file  Social History Narrative   Married   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    [employer]   Caffeine: in tea , maybe a soda every other day or every few days   Social Determinants of Corporate investment banker Strain: Not on file  Food Insecurity: Not on file  Transportation Needs: Not on file  Physical Activity: Not on file  Stress: Not on file  Social Connections: Not on file    Her Allergies Are:  Allergies  Allergen Reactions   Pollen Extract Cough and Shortness Of Breath  :   Her Current Medications  Are:  Outpatient Encounter Medications as of 02/13/2023  Medication Sig   albuterol (PROAIR HFA) 108 (90 Base) MCG/ACT inhaler Inhale 2 puffs into the lungs every 6 (six) hours as needed for wheezing or shortness of breath.   albuterol (PROVENTIL) (2.5 MG/3ML) 0.083% nebulizer solution Take 3 mLs (2.5 mg total) by nebulization every 6 (six) hours as needed for wheezing or shortness of breath.   EPINEPHrine 0.3 mg/0.3 mL IJ SOAJ injection Use as directed for anaphylactic reactions   Mometasone Furoate (ASMANEX HFA) 100 MCG/ACT AERO INHALE 2 PUFFS BY MOUTH TWICE DAILY   montelukast (SINGULAIR) 10 MG tablet Take 1 tablet (10 mg total) by mouth at bedtime. (Patient not taking: Reported on 02/13/2023)   omeprazole (PRILOSEC) 40 MG capsule TAKE 1 CAPSULE BY MOUTH DAILY (Patient not taking: Reported on 02/13/2023)   triamcinolone cream (KENALOG) 0.1 % Apply to affected area 1-2 times daily (Patient not taking: Reported on 02/13/2023)   No facility-administered encounter medications on file as of 02/13/2023.  :   Review of Systems:  Out of a complete 14 point review of systems, all are reviewed and negative with the exception of these symptoms as listed below:   Review of Systems  Neurological:        Patient is here alone for sleep consult. She states she has recently had elevated BP. Her doctor referred her for a sleep consult to rule out sleep apnea in case this is related. She endorses snoring. She has never had a sleep study. She does wake up to use the restroom early in the morning. ESS 13 FSS 38    Objective:  Neurological Exam  Physical Exam Physical Examination:   Vitals:   02/13/23 1422  BP: 139/85  Pulse: 78    General Examination: The patient is a very pleasant 47 y.o. female in no acute distress. She appears well-developed and well-nourished and well groomed.   HEENT: Normocephalic, atraumatic, pupils are equal, round and reactive to light, extraocular tracking is good without  limitation to gaze excursion or nystagmus noted. Hearing is grossly intact. Face is symmetric with normal facial animation. Speech is clear with no dysarthria noted. There is no hypophonia. There is no lip, neck/head, jaw or voice tremor. Neck is supple with full range of passive and active motion. There are no carotid bruits on auscultation. Oropharynx exam reveals: mild mouth dryness, good dental hygiene and mild airway crowding, due to small airway entry, tonsillar size of 1-2+, slightly larger on the left.  Mallampati class III.  Tongue protrudes  centrally and palate elevates symmetrically, neck circumference 15 three-quarter inches.  She has a mild overbite.  Nasal inspection reveals no deviated septum, small nasal anatomy.  Chest: Clear to auscultation without wheezing, rhonchi or crackles noted.  Heart: S1+S2+0, regular and normal without murmurs, rubs or gallops noted.   Abdomen: Soft, non-tender and non-distended.  Extremities: There is no pitting edema in the distal lower extremities bilaterally.   Skin: Warm and dry without trophic changes noted.   Musculoskeletal: exam reveals no obvious joint deformities.   Neurologically:  Mental status: The patient is awake, alert and oriented in all 4 spheres. Her immediate and remote memory, attention, language skills and fund of knowledge are appropriate. There is no evidence of aphasia, agnosia, apraxia or anomia. Speech is clear with normal prosody and enunciation. Thought process is linear. Mood is normal and affect is normal.  Cranial nerves II - XII are as described above under HEENT exam.  Motor exam: Normal bulk, strength and tone is noted. There is no obvious action or resting tremor.  Fine motor skills and coordination: grossly intact.  Cerebellar testing: No dysmetria or intention tremor. There is no truncal or gait ataxia.  Sensory exam: intact to light touch in the upper and lower extremities.  Gait, station and balance: She stands  easily. No veering to one side is noted. No leaning to one side is noted. Posture is age-appropriate and stance is narrow based. Gait shows normal stride length and normal pace. No problems turning are noted.   Assessment and plan:  In summary, Jordann Dolinski is a very pleasant 47 y.o.-year old female with an underlying medical history of allergies, anemia, asthma, reflux disease, plantar fasciitis, knee pain, and severe obesity with a BMI of over 40, whose history and physical exam are concerning for sleep disordered breathing, particularly obstructive sleep apnea (OSA). A laboratory attended sleep study is typically considered "gold standard" for evaluation of sleep disordered breathing.   I had a long chat with the patient about my findings and the diagnosis of sleep apnea, particularly OSA, its prognosis and treatment options. We talked about medical/conservative treatments, surgical interventions and non-pharmacological approaches for symptom control. I explained, in particular, the risks and ramifications of untreated moderate to severe OSA, especially with respect to developing cardiovascular disease down the road, including congestive heart failure (CHF), difficult to treat hypertension, cardiac arrhythmias (particularly A-fib), neurovascular complications including TIA, stroke and dementia. Even type 2 diabetes has, in part, been linked to untreated OSA. Symptoms of untreated OSA may include (but may not be limited to) daytime sleepiness, nocturia (i.e. frequent nighttime urination), memory problems, mood irritability and suboptimally controlled or worsening mood disorder such as depression and/or anxiety, lack of energy, lack of motivation, physical discomfort, as well as recurrent headaches, especially morning or nocturnal headaches. We talked about the importance of maintaining a healthy lifestyle and striving for healthy weight.  In addition, we talked about the importance of striving for and  maintaining good sleep hygiene. I recommended a sleep study at this time. I outlined the differences between a laboratory attended sleep study which is considered more comprehensive and accurate over the option of a home sleep test (HST); the latter may lead to underestimation of sleep disordered breathing in some instances and does not help with diagnosing upper airway resistance syndrome and is not accurate enough to diagnose primary central sleep apnea typically. I outlined possible surgical and non-surgical treatment options of OSA, including the use of a positive airway pressure (  PAP) device (i.e. CPAP, AutoPAP/APAP or BiPAP in certain circumstances), a custom-made dental device (aka oral appliance, which would require a referral to a specialist dentist or orthodontist typically, and is generally speaking not considered for patients with full dentures or edentulous state), upper airway surgical options, such as traditional UPPP (which is not considered a first-line treatment) or the Inspire device (hypoglossal nerve stimulator, which would involve a referral for consultation with an ENT surgeon, after careful selection, following inclusion criteria - also not first-line treatment). I explained the PAP treatment option to the patient in detail, as this is generally considered first-line treatment.  The patient indicated that she would be willing to try PAP therapy, if the need arises. I explained the importance of being compliant with PAP treatment, not only for insurance purposes but primarily to improve patient's symptoms symptoms, and for the patient's long term health benefit, including to reduce Her cardiovascular risks longer-term.    We will pick up our discussion about the next steps and treatment options after testing.  We will keep her posted as to the test results by phone call and/or MyChart messaging where possible.  We will plan to follow-up in sleep clinic accordingly as well.  I answered all  her questions today and the patient was in agreement.   I encouraged her to call with any interim questions, concerns, problems or updates or email [place] through MyChart.  Generally speaking, sleep test authorizations may take up to 2 weeks, sometimes less, sometimes longer, the patient is encouraged to get in touch with [place] if they do not hear back from the sleep lab staff directly within the next 2 weeks.  Thank you very much for allowing me to participate in the care of this nice patient. If I can be of any further assistance to you please do not hesitate to call me at [phone].  Sincerely,   Huston Foley, MD, PhD

## 2023-02-16 ENCOUNTER — Ambulatory Visit (INDEPENDENT_AMBULATORY_CARE_PROVIDER_SITE_OTHER): Payer: BC Managed Care – PPO

## 2023-02-16 DIAGNOSIS — J309 Allergic rhinitis, unspecified: Secondary | ICD-10-CM | POA: Diagnosis not present

## 2023-02-16 DIAGNOSIS — M722 Plantar fascial fibromatosis: Secondary | ICD-10-CM | POA: Diagnosis not present

## 2023-02-16 DIAGNOSIS — M25672 Stiffness of left ankle, not elsewhere classified: Secondary | ICD-10-CM | POA: Diagnosis not present

## 2023-02-16 DIAGNOSIS — M25561 Pain in right knee: Secondary | ICD-10-CM | POA: Diagnosis not present

## 2023-02-16 DIAGNOSIS — M62572 Muscle wasting and atrophy, not elsewhere classified, left ankle and foot: Secondary | ICD-10-CM | POA: Diagnosis not present

## 2023-02-28 ENCOUNTER — Ambulatory Visit: Payer: BC Managed Care – PPO

## 2023-02-28 ENCOUNTER — Ambulatory Visit
Admission: RE | Admit: 2023-02-28 | Discharge: 2023-02-28 | Disposition: A | Discharge status: Home / Self Care | Payer: BC Managed Care – PPO | Source: Ambulatory Visit | Attending: Physician Assistant | Admitting: Physician Assistant

## 2023-02-28 DIAGNOSIS — R922 Inconclusive mammogram: Secondary | ICD-10-CM | POA: Diagnosis not present

## 2023-02-28 DIAGNOSIS — R928 Other abnormal and inconclusive findings on diagnostic imaging of breast: Secondary | ICD-10-CM

## 2023-03-02 ENCOUNTER — Ambulatory Visit (INDEPENDENT_AMBULATORY_CARE_PROVIDER_SITE_OTHER): Payer: BC Managed Care – PPO

## 2023-03-02 DIAGNOSIS — J309 Allergic rhinitis, unspecified: Secondary | ICD-10-CM

## 2023-03-14 ENCOUNTER — Telehealth: Payer: Self-pay | Admitting: Neurology

## 2023-03-14 ENCOUNTER — Ambulatory Visit (INDEPENDENT_AMBULATORY_CARE_PROVIDER_SITE_OTHER): Payer: BC Managed Care – PPO

## 2023-03-14 DIAGNOSIS — J309 Allergic rhinitis, unspecified: Secondary | ICD-10-CM

## 2023-03-14 NOTE — Telephone Encounter (Signed)
03/14/23- LVM [place]  03/08/23 BCBS no auth req spoke to Ranger C ref # [id number]

## 2023-03-16 ENCOUNTER — Ambulatory Visit: Payer: BC Managed Care – PPO | Admitting: Allergy & Immunology

## 2023-03-21 ENCOUNTER — Other Ambulatory Visit: Payer: Self-pay

## 2023-03-21 ENCOUNTER — Ambulatory Visit (INDEPENDENT_AMBULATORY_CARE_PROVIDER_SITE_OTHER): Payer: BC Managed Care – PPO | Admitting: Allergy & Immunology

## 2023-03-21 ENCOUNTER — Encounter: Payer: Self-pay | Admitting: Allergy & Immunology

## 2023-03-21 VITALS — BP 130/80 | HR 94 | Temp 97.5°F | Resp 18 | Ht 67.0 in | Wt 269.8 lb

## 2023-03-21 DIAGNOSIS — J4530 Mild persistent asthma, uncomplicated: Secondary | ICD-10-CM | POA: Diagnosis not present

## 2023-03-21 DIAGNOSIS — J3089 Other allergic rhinitis: Secondary | ICD-10-CM

## 2023-03-21 DIAGNOSIS — T63481D Toxic effect of venom of other arthropod, accidental (unintentional), subsequent encounter: Secondary | ICD-10-CM | POA: Diagnosis not present

## 2023-03-21 DIAGNOSIS — K219 Gastro-esophageal reflux disease without esophagitis: Secondary | ICD-10-CM

## 2023-03-21 DIAGNOSIS — L237 Allergic contact dermatitis due to plants, except food: Secondary | ICD-10-CM

## 2023-03-21 DIAGNOSIS — J302 Other seasonal allergic rhinitis: Secondary | ICD-10-CM

## 2023-03-21 MED ORDER — PREDNISONE 10 MG PO TABS: ORAL_TABLET | ORAL | 0 refills | Status: DC

## 2023-03-21 NOTE — Progress Notes (Signed)
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 Mild persistent asthma, uncomplicated - Lung testing looked amazing today.  - It is a bit lower than last time.  - Increase the Asmanex to two puffs twice daily for now (tapering down when you feel that things are under better control).  - Daily controller medication(s): Asmanex two puffs twice daily with spacer and Singulair (montelukast) 10 mg daily  - Prior to physical activity: albuterol 2 puffs 10-15 minutes before physical activity. - Rescue medications: albuterol 4 puffs every 4-6 hours as needed or albuterol nebulizer one vial every 4-6 hours as needed - Asthma control goals:  * Full participation in all desired activities (may need albuterol before activity) * Albuterol use two time or less a week on average (not counting use with activity) * Cough interfering with sleep two time or less a month * Oral steroids no more than once a year * No hospitalizations  2. Seasonal and perennial allergic rhinitis (grasses, ragweed, weeds, trees, indoor molds, outdoor molds, dust mites, cat, dog and cockroach) - Continue with: Xyzal 5mg  (can use twice daily) AS NEEDED - Continue with allergy shots at the same schedule.  - Use the HEPA filter in the bedroom and make sure that you change the filter.  3. 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   4. GERD  - Continue with your anti reflux precautions.   5. Return in about 6 months (around 09/20/2023).  Subjective:   Kristina Garza is a 47 y.o. 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  Kristina Garza is a 47 y.o. female presenting for a follow up visit.  She was last seen in December 2023.  At that time, we continue with Flovent 110 mcg 2 puffs twice daily as well as montelukast.  For her allergic rhinitis, she continued on Flonase as well as nasal saline rinses and allergy shots.  She continue to avoid parents.  EpiPen is up-to-date.  Reflux is under good control with lifestyle modifications.  Since last visit, she has done well.   Asthma/Respiratory Symptom History: She remains on her Asmanex  two puffs twice daily. She has been doing one puff twice daily (which is what she was doing with the Flovent). She had  a bad virus in April that made things worse.   Allergic Rhinitis Symptom History: She actually weaned off of the montelukast. She has them to use as needed during flares. She has not been interested in doing the allergy pills routinely.  Worse time of the year is now.    Kristina Garza is on allergen immunotherapy. She receives two injections. Immunotherapy script #1 contains trees, weeds, grasses, cat, and dog. She currently receives 0.46mL of the RED vial (1/100). Immunotherapy script #2 contains  ragweed, molds, dust mites, and cockroach. She currently receives 0.77mL of the RED vial (1/100). She started  shots September of 2022 and reached maintenance in May of 2023.  Skin Symptom History: She first started having issues with her poison ivy a few days ago. She has some lesions around her eyes. It has been bad over the spring, but hse has been dealing with little dots and not systemic reactions .   Otherwise, there have been no changes to her past medical history, surgical history, family history, or social history.    Review of Systems  Constitutional: Negative.  Negative for chills, fever, malaise/fatigue and weight loss.  HENT:  Positive for congestion. Negative for ear discharge, ear pain and sinus pain.   Eyes:  Negative for pain, discharge and redness.  Respiratory:  Positive for shortness of breath. Negative for cough, sputum production and wheezing.   Cardiovascular: Negative.  Negative for chest pain and palpitations.  Gastrointestinal:  Negative for abdominal pain, constipation, diarrhea, heartburn, nausea and vomiting.  Skin: Negative.  Negative for itching and rash.  Neurological:  Negative for dizziness and headaches.  Endo/Heme/Allergies:  Positive for environmental allergies. Does not bruise/bleed easily.       Objective:   Blood pressure 130/80, pulse 94, temperature (!) 97.5 F (36.4 C), temperature source Temporal, resp. rate 18, height 5\' 7"  (1.702 m), weight 269 lb 12.8 oz (122.4 kg), SpO2 97 %. Body mass index is 42.26 kg/m.    Physical Exam Vitals reviewed.  Constitutional:      Appearance: She is well-developed.     Comments: Pleasant and talkative.   HENT:     Head: Normocephalic and atraumatic.     Right Ear: Tympanic membrane, ear canal and external ear normal.     Left Ear: Tympanic membrane, ear canal and external ear normal.     Nose: Rhinorrhea present. No nasal deformity, septal deviation or mucosal edema.     Right Turbinates: Enlarged, swollen and pale.     Left Turbinates: Enlarged, swollen and pale.     Right Sinus: No maxillary sinus  tenderness or frontal sinus tenderness.     Left Sinus: No maxillary sinus tenderness or frontal sinus tenderness.     Comments: No polyps.     Mouth/Throat:     Mouth: Mucous membranes are not pale and not dry.     Pharynx: Uvula midline.     Comments: Cobblestoning in the posterior oropharynx noted.  Eyes:     General: Lids are normal. No allergic shiner.       Right eye: No discharge.        Left eye: No discharge.     Conjunctiva/sclera: Conjunctivae normal.     Right eye: Right conjunctiva is not injected. No chemosis.    Left eye: Left conjunctiva is not injected. No chemosis.    Pupils: Pupils are equal, round, and reactive to light.  Cardiovascular:     Rate and Rhythm: Normal rate and regular rhythm.  Heart sounds: Normal heart sounds.  Pulmonary:     Effort: Pulmonary effort is normal. No tachypnea, accessory muscle usage or respiratory distress.     Breath sounds: Normal breath sounds. No wheezing, rhonchi or rales.     Comments: Moving air well in all lung fields.  No increased work of breathing. Chest:     Chest wall: No tenderness.  Lymphadenopathy:     Cervical: No cervical adenopathy.  Skin:    General: Skin is warm.     Capillary Refill: Capillary refill takes less than 2 seconds.     Coloration: Skin is not pale.     Findings: No abrasion, erythema, petechiae or rash. Rash is not papular, urticarial or vesicular.     Comments: She does have some erythema on her face bilaterally, but most prominently around her right eye. There are excoriations present.   Neurological:     Mental Status: She is alert.  Psychiatric:        Behavior: Behavior is cooperative.      Diagnostic studies:    Spirometry: results normal (FEV1: 2.51/80%, FVC: 2.87/73%, FEV1/FVC: 87%).    Spirometry consistent with normal pattern. These values are slightly lower than those obtained previously.   Allergy Studies: none        Malachi Bonds, MD  [facility]

## 2023-03-21 NOTE — Patient Instructions (Addendum)
1. Mild persistent asthma, uncomplicated - Lung testing looked amazing today.  - It is a bit lower than last time.  - Increase the Asmanex to two puffs twice daily for now (tapering down when you feel that things are under better control).  - Daily controller medication(s): Asmanex two puffs twice daily with spacer and Singulair (montelukast) 10 mg daily  - Prior to physical activity: albuterol 2 puffs 10-15 minutes before physical activity. - Rescue medications: albuterol 4 puffs every 4-6 hours as needed or albuterol nebulizer one vial every 4-6 hours as needed - Asthma control goals:  * Full participation in all desired activities (may need albuterol before activity) * Albuterol use two time or less a week on average (not counting use with activity) * Cough interfering with sleep two time or less a month * Oral steroids no more than once a year * No hospitalizations  2. Seasonal and perennial allergic rhinitis (grasses, ragweed, weeds, trees, indoor molds, outdoor molds, dust mites, cat, dog and cockroach) - Continue with: Xyzal 5mg  (can use twice daily) AS NEEDED - Continue with allergy shots at the same schedule.  - Use the HEPA filter in the bedroom and make sure that you change the filter.  3. 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   4. GERD  - Continue with your anti reflux precautions.   5. Return in about 6 months (around 09/20/2023).   Please inform us of any Emergency Department visits, hospitalizations, or changes in symptoms. Call us before going to the ED for breathing or allergy symptoms since we might be able to fit you in for a sick visit. Feel free to contact us anytime with any questions, problems, or concerns.  It was a pleasure to see you again today!  Websites that have reliable patient information: 1. American Academy of Asthma, Allergy, and Immunology: www.aaaai.org 2. Food Allergy Research and Education (FARE): foodallergy.org 3.  Mothers of Asthmatics: http://www.asthmacommunitynetwork.org 4. American College of Allergy, Asthma, and Immunology: www.acaai.org   COVID-19 Vaccine Information can be found at: [email or web address] For questions related to vaccine distribution or appointments, please email [email or web address] or call [phone].   We realize that you might be concerned about having an allergic reaction to the COVID19 vaccines. To help with that concern, WE ARE OFFERING THE COVID19 VACCINES IN OUR OFFICE! Ask the front desk for dates!     "Like" [place] on Facebook and Instagram for our latest updates!      A healthy democracy works best when [employer] participate! Make sure you are registered to vote! If you have moved or changed any of your contact information, you will need to get this updated before voting!  In some cases, you MAY be able to register to vote online: [email or web address]

## 2023-03-28 ENCOUNTER — Ambulatory Visit (INDEPENDENT_AMBULATORY_CARE_PROVIDER_SITE_OTHER): Payer: BC Managed Care – PPO

## 2023-03-28 DIAGNOSIS — J309 Allergic rhinitis, unspecified: Secondary | ICD-10-CM

## 2023-04-11 ENCOUNTER — Ambulatory Visit (INDEPENDENT_AMBULATORY_CARE_PROVIDER_SITE_OTHER): Payer: BC Managed Care – PPO

## 2023-04-11 DIAGNOSIS — J309 Allergic rhinitis, unspecified: Secondary | ICD-10-CM | POA: Diagnosis not present

## 2023-04-25 ENCOUNTER — Ambulatory Visit (INDEPENDENT_AMBULATORY_CARE_PROVIDER_SITE_OTHER): Payer: BC Managed Care – PPO

## 2023-04-25 DIAGNOSIS — J309 Allergic rhinitis, unspecified: Secondary | ICD-10-CM | POA: Diagnosis not present

## 2023-04-30 ENCOUNTER — Ambulatory Visit (INDEPENDENT_AMBULATORY_CARE_PROVIDER_SITE_OTHER): Payer: BC Managed Care – PPO | Admitting: Dermatology

## 2023-04-30 VITALS — BP 156/99 | HR 73

## 2023-04-30 DIAGNOSIS — D229 Melanocytic nevi, unspecified: Secondary | ICD-10-CM

## 2023-04-30 DIAGNOSIS — L578 Other skin changes due to chronic exposure to nonionizing radiation: Secondary | ICD-10-CM

## 2023-04-30 DIAGNOSIS — D485 Neoplasm of uncertain behavior of skin: Secondary | ICD-10-CM

## 2023-04-30 DIAGNOSIS — Z1283 Encounter for screening for malignant neoplasm of skin: Secondary | ICD-10-CM | POA: Diagnosis not present

## 2023-04-30 DIAGNOSIS — W908XXA Exposure to other nonionizing radiation, initial encounter: Secondary | ICD-10-CM

## 2023-04-30 DIAGNOSIS — D239 Other benign neoplasm of skin, unspecified: Secondary | ICD-10-CM

## 2023-04-30 DIAGNOSIS — L821 Other seborrheic keratosis: Secondary | ICD-10-CM

## 2023-04-30 DIAGNOSIS — L814 Other melanin hyperpigmentation: Secondary | ICD-10-CM | POA: Diagnosis not present

## 2023-04-30 DIAGNOSIS — D225 Melanocytic nevi of trunk: Secondary | ICD-10-CM

## 2023-04-30 DIAGNOSIS — L304 Erythema intertrigo: Secondary | ICD-10-CM

## 2023-04-30 DIAGNOSIS — D1801 Hemangioma of skin and subcutaneous tissue: Secondary | ICD-10-CM

## 2023-04-30 HISTORY — DX: Other benign neoplasm of skin, unspecified: D23.9

## 2023-04-30 MED ORDER — NYSTATIN-TRIAMCINOLONE 100000-0.1 UNIT/GM-% EX CREA: TOPICAL_CREAM | CUTANEOUS | 0 refills | Status: DC

## 2023-04-30 NOTE — Patient Instructions (Addendum)
Hi Kristina Garza,  Thank you for visiting my office today. I appreciate your commitment to maintaining your skin health and taking proactive steps in monitoring any changes. Here is a summary of our discussion and the instructions for your care:  - Sunscreen Use: Apply sunscreen daily to your face, neck, and chest to prevent further sun damage.  - Shave Biopsy: We performed a shave biopsy on an irregular mole located on your left lower back. Please keep the area covered with ointment and a bandage. It should heal within one to two weeks.  - Skin Care Post-Biopsy: Keep the biopsy site clean, apply a small amount of Vaseline, and cover with a Band-Aid after showering.  - Intertrigo Treatment: Use Nystatin Triamcinolone cream on the affected area of your breast for up to 10 days. If the condition improves in four to five days, you may stop using the cream early. Avoid using it past ten days to prevent skin thinning. Use Zeasorb powder to keep the area dry and prevent recurrence.  - Follow-Up: Results from the biopsy will be communicated via MyChart. If the mole is found to be severely abnormal, we will contact you directly to discuss further steps.  Please feel free to reach out if you have any questions or concerns about your treatment plan. We are here to assist you in any way possible.   Wound Care Instructions  Cleanse wound gently with soap and water once a day then pat dry with clean gauze. Apply a thin coat of Petrolatum (petroleum jelly, "Vaseline") over the wound (unless you have an allergy to this). We recommend that you use a new, sterile tube of Vaseline. Do not pick or remove scabs. Do not remove the yellow or white "healing tissue" from the base of the wound.  Cover the wound with fresh, clean, nonstick gauze and secure with paper tape. You may use Band-Aids in place of gauze and tape if the wound is small enough, but would recommend trimming much of the tape off as there is often too much.  Sometimes Band-Aids can irritate the skin.  You should call the office for your biopsy report after 1 week if you have not already been contacted.  If you experience any problems, such as abnormal amounts of bleeding, swelling, significant bruising, significant pain, or evidence of infection, please call the office immediately.  FOR ADULT SURGERY PATIENTS: If you need something for pain relief you may take 1 extra strength Tylenol (acetaminophen) AND 2 Ibuprofen (200mg  each) together every 4 hours as needed for pain. (do not take these if you are allergic to them or if you have a reason you should not take them.) Typically, you may only need pain medication for 1 to 3 days.    Due to recent changes in healthcare laws, you may see results of your pathology and/or laboratory studies on MyChart before the doctors have had a chance to review them. We understand that in some cases there may be results that are confusing or concerning to you. Please understand that not all results are received at the same time and often the doctors may need to interpret multiple results in order to provide you with the best plan of care or course of treatment. Therefore, we ask that you please give [place] 2 business days to thoroughly review all your results before contacting the office for clarification. Should we see a critical lab result, you will be contacted sooner.   If You Need Anything After Your Visit  If you have any questions or concerns for your doctor, please call our main line at [phone] If no one answers, please leave a voicemail as directed and we will return your call as soon as possible. Messages left after 4 pm will be answered the following business day.   You may also send [place] a message via MyChart. We typically respond to MyChart messages within 1-2 business days.  For prescription refills, please ask your pharmacy to contact our office. Our fax number is [phone].  If you have an urgent issue  when the clinic is closed that cannot wait until the next business day, you can page your doctor at the number below.    Please note that while we do our best to be available for urgent issues outside of office hours, we are not available 24/7.   If you have an urgent issue and are unable to reach [place], you may choose to seek medical care at your doctor's office, retail clinic, urgent care center, or emergency room.  If you have a medical emergency, please immediately call 911 or go to the emergency department. In the event of inclement weather, please call our main line at [phone] for an update on the status of any delays or closures.  Dermatology Medication Tips: Please keep the boxes that topical medications come in in order to help keep track of the instructions about where and how to use these. Pharmacies typically print the medication instructions only on the boxes and not directly on the medication tubes.   If your medication is too expensive, please contact our office at [phone] or send [place] a message through MyChart.   We are unable to tell what your co-pay for medications will be in advance as this is different depending on your insurance coverage. However, we may be able to find a substitute medication at lower cost or fill out paperwork to get insurance to cover a needed medication.   If a prior authorization is required to get your medication covered by your insurance company, please allow [place] 1-2 business days to complete this process.  Drug prices often vary depending on where the prescription is filled and some pharmacies may offer cheaper prices.  The website www.goodrx.com contains coupons for medications through different pharmacies. The prices here do not account for what the cost may be with help from insurance (it may be cheaper with your insurance), but the website can give you the price if you did not use any insurance.  - You can print the associated coupon and take  it with your prescription to the pharmacy.  - You may also stop by our office during regular business hours and pick up a GoodRx coupon card.  - If you need your prescription sent electronically to a different pharmacy, notify our office through [facility] or by phone at [phone]

## 2023-04-30 NOTE — Progress Notes (Signed)
New Patient Visit   Subjective  Kristina Garza is a 47 y.o. female who presents for the following: Skin Cancer Screening and Full Body Skin Exam. No personal hx of skin cancer or dysplastic nevi. Possible family hx of skin cancer in father, patient unsure which type, but states there is a lesion that is being watched on the forearm by his physician.  The patient presents for Total-Body Skin Exam (TBSE) for skin cancer screening and mole check. The patient has spots, moles and lesions to be evaluated, some may be new or changing and the patient may have concern these could be cancer.    The following portions of the chart were reviewed this encounter and updated as appropriate: medications, allergies, medical history  Review of Systems:  No other skin or systemic complaints except as noted in HPI or Assessment and Plan.  Objective  Well appearing patient in no apparent distress; mood and affect are within normal limits.  A full examination was performed including scalp, head, eyes, ears, nose, lips, neck, chest, axillae, abdomen, back, buttocks, bilateral upper extremities, bilateral lower extremities, hands, feet, fingers, toes, fingernails, and toenails. All findings within normal limits unless otherwise noted below.   Relevant physical exam findings are noted in the Assessment and Plan.  L lower back 0.6 x 0.3 cm dark brown macule.       Assessment & Plan   SKIN CANCER SCREENING PERFORMED TODAY.  ACTINIC DAMAGE - (mild, on the chest) - Chronic condition, secondary to cumulative UV/sun exposure - diffuse scaly erythematous macules with underlying dyspigmentation - Recommend daily broad spectrum sunscreen SPF 30+ to sun-exposed areas, reapply every 2 hours as needed.  - Staying in the shade or wearing long sleeves, sun glasses (UVA+UVB protection) and wide brim hats (4-inch brim around the entire circumference of the hat) are also recommended for sun protection.  - Call for new  or changing lesions.  LENTIGINES, SEBORRHEIC KERATOSES, HEMANGIOMAS - Benign normal skin lesions - Benign-appearing - Call for any changes  MELANOCYTIC NEVI - Tan-brown and/or pink-flesh-colored symmetric macules and papules - Benign appearing on exam today - Observation - Call clinic for new or changing moles - Recommend daily use of broad spectrum spf 30+ sunscreen to sun-exposed areas.   Neoplasm of uncertain behavior of skin L lower back  Epidermal / dermal shaving  Lesion diameter (cm):  0.6 Informed consent: discussed and consent obtained   Timeout: patient name, date of birth, surgical site, and procedure verified   Procedure prep:  Patient was prepped and draped in usual sterile fashion Prep type:  Isopropyl alcohol Anesthesia: the lesion was anesthetized in a standard fashion   Anesthetic:  1% lidocaine w/ epinephrine 1-100,000 buffered w/ 8.4% NaHCO3 Instrument used: DermaBlade   Hemostasis achieved with: pressure, aluminum chloride and electrodesiccation   Outcome: patient tolerated procedure well   Post-procedure details: sterile dressing applied and wound care instructions given   Dressing type: bandage and petrolatum    Specimen 1 - Surgical pathology Differential Diagnosis: D48.5 r/o dysplastic nevus Check Margins: No   INTERTRIGO Exam Erythematous macerated patches  Chronic and persistent condition with duration or expected duration over one year. Condition is symptomatic/ bothersome to patient. Not currently at goal.  Intertrigo is a chronic recurrent rash that occurs in skin fold areas that may be associated with friction; heat; moisture; yeast; fungus; and bacteria.  It is exacerbated by increased movement / activity; sweating; and higher atmospheric temperature.  Treatment Plan Start Nystatin/TMC mix QD  up to 10 days. Recommend Zeasorb AF powder to prevent recurrence.  Return in about 1 year (around 04/29/2024) for TBSE.  Maylene Roes, CMA, am  acting as scribe for [employer], DO .  Documentation: I have reviewed the above documentation for accuracy and completeness, and I agree with the above.  Langston Reusing, DO

## 2023-05-03 NOTE — Progress Notes (Signed)
Hi Kristina Garza  Dr. Onalee Hua reviewed your biopsy results and they showed the spot removed was a little "abnormal" but not cancerous.  No additional treatment is required.  We will continue to monitor the area for re-pigmentation during your annual skin exams. The detailed report is available to view in MyChart.  Have a great day!  Kind Regards,  Dr. Kermit Balo Care Team

## 2023-05-04 ENCOUNTER — Ambulatory Visit: Payer: Self-pay

## 2023-05-04 DIAGNOSIS — J309 Allergic rhinitis, unspecified: Secondary | ICD-10-CM

## 2023-05-07 ENCOUNTER — Encounter: Payer: Self-pay | Admitting: Dermatology

## 2023-05-18 ENCOUNTER — Ambulatory Visit (INDEPENDENT_AMBULATORY_CARE_PROVIDER_SITE_OTHER): Payer: BC Managed Care – PPO

## 2023-05-18 DIAGNOSIS — J309 Allergic rhinitis, unspecified: Secondary | ICD-10-CM | POA: Diagnosis not present

## 2023-05-25 ENCOUNTER — Ambulatory Visit (INDEPENDENT_AMBULATORY_CARE_PROVIDER_SITE_OTHER): Payer: BC Managed Care – PPO

## 2023-05-25 DIAGNOSIS — J309 Allergic rhinitis, unspecified: Secondary | ICD-10-CM

## 2023-05-30 ENCOUNTER — Ambulatory Visit (INDEPENDENT_AMBULATORY_CARE_PROVIDER_SITE_OTHER): Payer: BC Managed Care – PPO

## 2023-05-30 DIAGNOSIS — J309 Allergic rhinitis, unspecified: Secondary | ICD-10-CM | POA: Diagnosis not present

## 2023-06-12 ENCOUNTER — Encounter: Payer: Self-pay | Admitting: Allergy & Immunology

## 2023-06-13 ENCOUNTER — Ambulatory Visit: Payer: Self-pay

## 2023-06-13 DIAGNOSIS — J309 Allergic rhinitis, unspecified: Secondary | ICD-10-CM | POA: Diagnosis not present

## 2023-06-13 MED ORDER — LEVOCETIRIZINE DIHYDROCHLORIDE 5 MG PO TABS: 5.0000 mg | ORAL_TABLET | Freq: Every evening | ORAL | 2 refills | Status: DC

## 2023-06-13 NOTE — Telephone Encounter (Signed)
Patient came in today to check on the status of getting the medications resent in to the pharmacy.

## 2023-06-14 ENCOUNTER — Other Ambulatory Visit: Payer: Self-pay | Admitting: Allergy & Immunology

## 2023-06-29 ENCOUNTER — Ambulatory Visit (INDEPENDENT_AMBULATORY_CARE_PROVIDER_SITE_OTHER): Payer: Self-pay

## 2023-06-29 DIAGNOSIS — J309 Allergic rhinitis, unspecified: Secondary | ICD-10-CM

## 2023-07-06 NOTE — Progress Notes (Signed)
EXP 07/08/24

## 2023-07-09 DIAGNOSIS — J3081 Allergic rhinitis due to animal (cat) (dog) hair and dander: Secondary | ICD-10-CM | POA: Diagnosis not present

## 2023-07-11 ENCOUNTER — Ambulatory Visit (INDEPENDENT_AMBULATORY_CARE_PROVIDER_SITE_OTHER): Payer: BC Managed Care – PPO

## 2023-07-11 DIAGNOSIS — J309 Allergic rhinitis, unspecified: Secondary | ICD-10-CM | POA: Diagnosis not present

## 2023-07-25 ENCOUNTER — Ambulatory Visit (INDEPENDENT_AMBULATORY_CARE_PROVIDER_SITE_OTHER): Payer: Self-pay

## 2023-07-25 DIAGNOSIS — J309 Allergic rhinitis, unspecified: Secondary | ICD-10-CM

## 2023-07-31 ENCOUNTER — Ambulatory Visit (INDEPENDENT_AMBULATORY_CARE_PROVIDER_SITE_OTHER): Payer: BC Managed Care – PPO | Admitting: Neurology

## 2023-07-31 DIAGNOSIS — Z9189 Other specified personal risk factors, not elsewhere classified: Secondary | ICD-10-CM

## 2023-07-31 DIAGNOSIS — G4733 Obstructive sleep apnea (adult) (pediatric): Secondary | ICD-10-CM

## 2023-07-31 DIAGNOSIS — G4720 Circadian rhythm sleep disorder, unspecified type: Secondary | ICD-10-CM

## 2023-07-31 DIAGNOSIS — Z820 Family history of epilepsy and other diseases of the nervous system: Secondary | ICD-10-CM

## 2023-07-31 DIAGNOSIS — G4719 Other hypersomnia: Secondary | ICD-10-CM

## 2023-07-31 DIAGNOSIS — R030 Elevated blood-pressure reading, without diagnosis of hypertension: Secondary | ICD-10-CM

## 2023-07-31 DIAGNOSIS — R0683 Snoring: Secondary | ICD-10-CM | POA: Diagnosis not present

## 2023-08-01 NOTE — Procedures (Unsigned)
Physician Interpretation:    Referred by: Jarold Motto, [place]    History and Indication for Testing: 47 year old female with an underlying medical history of allergies, anemia, asthma, reflux disease, plantar fasciitis, knee pain, and severe obesity with a BMI of over 40, who reports snoring and excessive daytime somnolence. She has had elevated blood pressure values. Her Epworth sleepiness score is 13 out of 24, fatigue severity score is 38 out of 63.    EEG: Review of the EEG showed no abnormal electrical discharges and symmetrical bihemispheric findings.     EKG: The EKG revealed normal sinus rhythm (NSR). ***   AUDIO/VIDEO REVIEW: The audio and video review did not show any abnormal or unusual behaviors, movements, phonations or vocalizations. The patient took no restroom breaks. Snoring was noted, in the mild to moderate range.   POST-STUDY QUESTIONNAIRE: Post study, the patient indicated, that sleep was worse than usual.    IMPRESSION:   Mild Obstructive Sleep Apnea (OSA) Dysfunctions associated with sleep stages or arousal from sleep   RECOMMENDATIONS:   This study demonstrates overall mild obstructive sleep apnea, severe in REM sleep with a total AHI of 5.9/hour, REM AHI of 33.2/hour, and O2 nadir of 69% (during REM sleep). Please note, the absence of supine sleep likely underestimated her sleep disordered breathing. Given the patient's medical history and sleep related complaints, treatment with positive airway pressure is recommended; this can be achieved in the form of autoPAP. Alternatively, a full-night CPAP titration study would allow optimization of therapy if needed. Other treatment options may include avoidance of supine sleep position along with weight loss, or the use of an oral appliance in selected patients. Please note, that untreated obstructive sleep apnea may carry additional perioperative morbidity. Patients with significant obstructive sleep apnea should  receive perioperative PAP therapy and the surgeons and particularly the anesthesiologist should be informed of the diagnosis and the severity of the sleep disordered breathing. Severe PLMs (periodic limb movements of sleep) were noted during this study with mild arousals; clinical correlation is recommended. The patient has a history of restless legs syndrome and takes ropinirole. Medication effect from the antidepressant medication should be considered.  This study shows significant sleep fragmentation and abnormal sleep stage percentages; these are nonspecific findings and per se do not signify an intrinsic sleep disorder or a cause for the patient's sleep-related symptoms. Causes include (but are not limited to) the first night effect of the sleep study, circadian rhythm disturbances, medication effect or an underlying mood disorder or medical problem.  The patient should be cautioned not to drive, work at heights, or operate dangerous or heavy equipment when tired or sleepy. Review and reiteration of good sleep hygiene measures should be pursued with any patient. The patient will be seen in follow-up by Dr. Frances Furbish at [facility] for discussion of the test results and further management strategies. The referring provider will be notified of the test results.         I certify that I have reviewed the entire raw data recording prior to the issuance of this report in accordance with the Standards of Accreditation of the American Academy of Sleep Medicine (AASM).   Huston Foley, MD, PhD Medical Director, [facility]) Diplomat, ABPN (Neurology and Sleep)   Technical Report:   General Information  Name: Kristina Garza, Kristina Garza BMI: 42.60 Physician: Huston Foley, MD  ID: [id number] Height: 67.0 in Technician: Margaretann Loveless, RPSGT  Sex: Female Weight: 272.0 lb Record: [id number]  Age:  47 [May 09, 1976] Date: 07/31/2023    Medical & Medication History    47 year old female  with an underlying medical history of allergies, anemia, asthma, reflux disease, plantar fasciitis, knee pain, and severe obesity with a BMI of over 40, who reports snoring and excessive daytime somnolence.  Proair, Proventil, Epinephrine, Mometasone Furoate, Singulair, Prilosec, Kenalog   Sleep Disorder      Comments   The patient came into the sleep lab for a PSG. No restroom breaks. EKG did not show any obvious cardiac arrhythmias. Mild to moderate snoring. Respiratory events scored with a 3% desat. Respiratory events worse in REM. Leg movements noted. The patient slept prone and lateral. There were periods of WASO. AHI was 4.7 after 2 hrs of TST.     Lights out: 10:04:51 PM Lights on: 05:00:06 AM   Time Total Supine Side Prone Upright  Recording (TRT) 6h 51.36m 0h 21.84m 2h 29.39m 4h 1.87m 0h 0.62m  Sleep (TST) 5h 14.13m 0h 0.97m 1h 34.3m 3h 40.41m 0h 0.52m   Latency N1 N2 N3 REM Onset Per. Slp. Eff.  Actual 0h 0.61m 0h 1.23m 4h 33.14m 5h 14.27m 0h 48.76m 0h 48.22m 76.31%   Stg Dur Wake N1 N2 N3 REM  Total 97.5 33.5 222.5 20.0 38.0  Supine 21.5 0.0 0.0 0.0 0.0  Side 55.0 14.0 80.0 0.0 0.0  Prone 21.0 19.5 142.5 20.0 38.0  Upright 0.0 0.0 0.0 0.0 0.0   Stg % Wake N1 N2 N3 REM  Total 23.7 10.7 70.9 6.4 12.1  Supine 5.2 0.0 0.0 0.0 0.0  Side 13.4 4.5 25.5 0.0 0.0  Prone 5.1 6.2 45.4 6.4 12.1  Upright 0.0 0.0 0.0 0.0 0.0     Apnea Summary Sub Supine Side Prone Upright  Total 1 Total 1 0 0 1 0    REM 1 0 0 1 0    NREM 0 0 0 0 0  Obs 1 REM 1 0 0 1 0    NREM 0 0 0 0 0  Mix 0 REM 0 0 0 0 0    NREM 0 0 0 0 0  Cen 0 REM 0 0 0 0 0    NREM 0 0 0 0 0   Rera Summary Sub Supine Side Prone Upright  Total 0 Total 0 0 0 0 0    REM 0 0 0 0 0    NREM 0 0 0 0 0   Hypopnea Summary Sub Supine Side Prone Upright  Total 30 Total 30 0 7 23 0    REM 20 0 0 20 0    NREM 10 0 7 3 0   4% Hypopnea Summary Sub Supine Side Prone Upright  Total (4%) 19 Total 19 0 1 18 0    REM 16 0 0 16 0    NREM 3 0 1 2 0      AHI Total Obs Mix Cen  5.92 Apnea 0.19 0.19 0.00 0.00   Hypopnea 5.73 -- -- --  3.82 Hypopnea (4%) 3.63 -- -- --    Total Supine Side Prone Upright  Position AHI 5.92 0.00 4.47 6.55 0.00  REM AHI 33.16   NREM AHI 2.17   Position RDI 5.92 0.00 4.47 6.55 0.00  REM RDI 33.16   NREM RDI 2.17    4% Hypopnea Total Supine Side Prone Upright  Position AHI (4%) 3.82 0.00 0.64 5.18 0.00  REM AHI (4%) 26.84   NREM AHI (4%) 0.65   Position RDI (4%)  3.82 0.00 0.64 5.18 0.00  REM RDI (4%) 26.84   NREM RDI (4%) 0.65    Desaturation Information Threshold: 2% <100% <90% <80% <70% <60% <50% <40%  Supine 4.0 0.0 0.0 0.0 0.0 0.0 0.0  Side 85.0 0.0 0.0 0.0 0.0 0.0 0.0  Prone 103.0 13.0 5.0 1.0 0.0 0.0 0.0  Upright 0.0 0.0 0.0 0.0 0.0 0.0 0.0  Total 192.0 13.0 5.0 1.0 0.0 0.0 0.0  Index 31.7 2.1 0.8 0.2 0.0 0.0 0.0   Threshold: 3% <100% <90% <80% <70% <60% <50% <40%  Supine 0.0 0.0 0.0 0.0 0.0 0.0 0.0  Side 16.0 0.0 0.0 0.0 0.0 0.0 0.0  Prone 26.0 13.0 5.0 1.0 0.0 0.0 0.0  Upright 0.0 0.0 0.0 0.0 0.0 0.0 0.0  Total 42.0 13.0 5.0 1.0 0.0 0.0 0.0  Index 6.9 2.1 0.8 0.2 0.0 0.0 0.0   Threshold: 4% <100% <90% <80% <70% <60% <50% <40%  Supine 0.0 0.0 0.0 0.0 0.0 0.0 0.0  Side 5.0 0.0 0.0 0.0 0.0 0.0 0.0  Prone 19.0 12.0 4.0 1.0 0.0 0.0 0.0  Upright 0.0 0.0 0.0 0.0 0.0 0.0 0.0  Total 24.0 12.0 4.0 1.0 0.0 0.0 0.0  Index 4.0 2.0 0.7 0.2 0.0 0.0 0.0   Threshold: 3% <100% <90% <80% <70% <60% <50% <40%  Supine 0 0 0 0 0 0 0  Side 16 0 0 0 0 0 0  Prone 26 13 5 1  0 0 0  Upright 0 0 0 0 0 0 0  Total 42 13 5 1  0 0 0   Awakening/Arousal Information # of Awakenings 15  Wake after sleep onset 49.32m  Wake after persistent sleep 49.62m   Arousal Assoc. Arousals Index  Apneas 1 0.2  Hypopneas 5 1.0  Leg Movements 0 0.0  Snore 0 0.0  PTT Arousals 0 0.0  Spontaneous 41 7.8  Total 47 9.0  Leg Movement Information PLMS LMs Index  Total LMs during PLMS 25 4.8  LMs w/ Microarousals 0 0.0    LM LMs Index  w/ Microarousal 0 0.0  w/ Awakening 0 0.0  w/ Resp Event 0 0.0  Spontaneous 42 8.0  Total 42 8.0     Desaturation threshold setting: 3% Minimum desaturation setting: 10 seconds SaO2 nadir: 62% The longest event was a 85 sec obstructive Hypopnea with a minimum SaO2 of 80%. The lowest SaO2 was 62% associated with a 58 sec obstructive Apnea. EKG Rates EKG Avg Max Min  Awake 79 99 66  Asleep 76 90 62  EKG Events: Tachycardia

## 2023-08-02 NOTE — Addendum Note (Signed)
Addended by: Huston Foley on: 08/02/2023 07:41 PM   Modules accepted: Orders

## 2023-08-09 ENCOUNTER — Telehealth: Payer: Self-pay | Admitting: *Deleted

## 2023-08-09 NOTE — Telephone Encounter (Signed)
-----   Message from Huston Foley sent at 08/02/2023  7:41 PM EDT ----- Patient referred by PCP, seen by me on 02/13/23, diagnostic PSG on 07/31/23.    Please call and notify the patient that the recent sleep study showed overall mild OSA, but severe during dream/REM sleep with severe desaturations in Dream sleep. I recommend treatment in the form of autoPAP, which means, that we don't have to bring her back for a second sleep study with CPAP, but will let him try an autoPAP machine at home, through a DME company (of her choice, or as per insurance requirement). The DME representative will educate her on how to use the machine, how to put the mask on, etc. I have placed an order in the chart. Please send referral, talk to patient, send report to referring MD. We will need a FU in sleep clinic for 10 weeks post-PAP set up, please arrange that with me or one of our NPs. Thanks,   Huston Foley, MD, PhD [facility])

## 2023-08-09 NOTE — Telephone Encounter (Signed)
I spoke with the patient and discussed her sleep study results.  The patient verbalized understanding and is amenable to starting AutoPap at home.  We discussed the insurance compliance requirements which includes using the machine at least 4 hours at night and also being seen in our office for follow-up.  The patient requested to be set up and have her follow-up appointment by the end of the year so we have scheduled her for a video visit on Monday, 10/01/2023 at 11:45 PM. The patient will be in touch soon with [facility] (her preferred DME d/t location) and if she is unable to be set up quickly, she will let us know so we will send her order elsewhere.  The patient's questions were answered and she verbalized appreciation for the call.  Autopap referral faxed to [facility]. Received a receipt of confirmation. Sleep study result sent to referring provider.

## 2023-08-15 ENCOUNTER — Ambulatory Visit (INDEPENDENT_AMBULATORY_CARE_PROVIDER_SITE_OTHER): Payer: Self-pay

## 2023-08-15 DIAGNOSIS — J309 Allergic rhinitis, unspecified: Secondary | ICD-10-CM | POA: Diagnosis not present

## 2023-08-29 ENCOUNTER — Ambulatory Visit (INDEPENDENT_AMBULATORY_CARE_PROVIDER_SITE_OTHER): Payer: BC Managed Care – PPO

## 2023-08-29 DIAGNOSIS — J309 Allergic rhinitis, unspecified: Secondary | ICD-10-CM | POA: Diagnosis not present

## 2023-09-21 ENCOUNTER — Ambulatory Visit (INDEPENDENT_AMBULATORY_CARE_PROVIDER_SITE_OTHER): Payer: BC Managed Care – PPO | Admitting: Allergy & Immunology

## 2023-09-21 ENCOUNTER — Encounter: Payer: Self-pay | Admitting: Allergy & Immunology

## 2023-09-21 VITALS — BP 150/92 | HR 84 | Temp 97.7°F | Resp 16 | Wt 272.1 lb

## 2023-09-21 DIAGNOSIS — K219 Gastro-esophageal reflux disease without esophagitis: Secondary | ICD-10-CM | POA: Diagnosis not present

## 2023-09-21 DIAGNOSIS — J4530 Mild persistent asthma, uncomplicated: Secondary | ICD-10-CM | POA: Diagnosis not present

## 2023-09-21 DIAGNOSIS — J302 Other seasonal allergic rhinitis: Secondary | ICD-10-CM

## 2023-09-21 DIAGNOSIS — J3089 Other allergic rhinitis: Secondary | ICD-10-CM

## 2023-09-21 DIAGNOSIS — T63481D Toxic effect of venom of other arthropod, accidental (unintentional), subsequent encounter: Secondary | ICD-10-CM

## 2023-09-21 MED ORDER — EPINEPHRINE 0.3 MG/0.3ML IJ SOAJ: INTRAMUSCULAR | 1 refills | Status: DC

## 2023-09-21 MED ORDER — MONTELUKAST SODIUM 10 MG PO TABS: 10.0000 mg | ORAL_TABLET | Freq: Every day | ORAL | 11 refills | Status: DC

## 2023-09-21 MED ORDER — ASMANEX HFA 100 MCG/ACT IN AERO: INHALATION_SPRAY | RESPIRATORY_TRACT | 11 refills | Status: DC

## 2023-09-21 NOTE — Progress Notes (Signed)
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 Mild persistent asthma, uncomplicated - I think we are on the right track.  - Lung testing looks MUCH better than the last time.  - Daily controller medication(s): Asmanex two puffs twice daily with spacer and Singulair (montelukast) 10 mg daily  - Prior to physical activity: albuterol 2 puffs 10-15 minutes before physical activity. - Rescue medications: albuterol 4 puffs every 4-6 hours as needed or albuterol nebulizer one vial every 4-6 hours as needed - Asthma control goals:  * Full participation in all desired activities (may need albuterol before activity) * Albuterol use two time or less a week on average (not counting use with activity) * Cough interfering with sleep two time or less a month * Oral steroids no more than once a year * No hospitalizations  2. Seasonal and perennial allergic rhinitis (grasses, ragweed, weeds, trees, indoor molds, outdoor molds, dust mites, cat, dog and cockroach) - Continue with: Xyzal 5mg  (can use twice daily) AS NEEDED - Continue with allergy shots at the same schedule.  - Use the HEPA filter in the bedroom and make sure that you change the filter. - Maintenance was reached in May 2023, so we  continue with at least 3 years (May 2026) or up to 5 years (May 2028).  3. 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   4. GERD  - Continue with your anti reflux precautions.   5. Return in about 1 year (around 09/20/2024). You can have the follow up appointment with Dr. Dellis Anes or a Nurse Practicioner (our Nurse Practitioners are excellent and always have  Physician oversight!).   Subjective:   Kristina Garza is a 47 y.o. 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  Discussed the use of AI scribe software for clinical note transcription with the patient and/or guardian, who gave verbal consent to proceed.  Kristina Garza is a 47 y.o. female presenting for a follow up visit.  She was last seen in June 2024.  At that time, lung testing looked amazing.  We recommended increasing her Asmanex to 2 puffs twice daily for a few days because it was slightly lower than before.  We continue with Singulair 10 mg daily.  For her rhinitis, we continue with Xyzal as well as allergy shots.  She had an up-to-date EpiPen for her fire ant anaphylaxis.  We continue with antireflux precautions for her reflux.  Since the last visit, she has done well.   Kristina Garza is currently on allergy shots, reports no adverse reactions to the treatment. She has been receiving shots every two weeks, with no significant changes in her condition. The patient also reports  a recent COVID-19 infection, which was managed with Paxlovid. She experienced a metallic taste in her mouth during treatment, but overall, she believes the medication was effective. She still has some residual congestion but did not develop a severe cough, which she typically experiences with respiratory infections.  The patient has been managing her asthma with twice-daily doses of AstraZeneca, which she reports is affordable due to a manufacturer discount. She has  not noticed any significant changes in her breathing and believes her allergy shots may be contributing to her asthma management. She has not reported any significant changes in her condition or any new symptoms.  The patient's work involves analyzing grocery trends, which she reports has been stressful in recent months due to uncertainties in the market.  She is also in a more managerial role which Kristina Garza sensitive headaches.  Despite this, she has not reported any work-related health issues.     Kristina Garza is on allergen immunotherapy. She receives two injections. Immunotherapy script #1 contains trees, weeds, grasses, cat, and dog. She currently receives 0.10mL of the RED vial (1/100). Immunotherapy script #2 contains  ragweed, molds, dust mites, and cockroach. She currently receives 0.66mL of the RED vial (1/100). She started shots September of 2022 and reached maintenance in May of 2023.  Otherwise, there have been no changes to her past medical history, surgical history, family history, or social history.    Review of systems otherwise negative other than that mentioned in the HPI.    Objective:   Blood pressure (!) 150/92, pulse 84, temperature 97.7 F (36.5 C), resp. rate 16, weight 272 lb 2 oz (123.4 kg), SpO2 98%. Body mass index is 42.62 kg/m.    Physical Exam Vitals reviewed.  Constitutional:      Appearance: She is well-developed.     Comments: Pleasant and talkative.   HENT:     Head: Normocephalic and atraumatic.     Right Ear: Tympanic membrane, ear canal and external ear normal.     Left Ear: Tympanic membrane, ear canal and external ear normal.     Nose: No nasal deformity, septal deviation or rhinorrhea.     Right Turbinates: Enlarged, swollen and pale.     Left Turbinates: Enlarged, swollen and pale.     Right Sinus: No maxillary sinus tenderness or frontal sinus tenderness.     Left Sinus: No maxillary sinus tenderness or frontal sinus tenderness.     Comments: No  polyps.     Mouth/Throat:     Mouth: Mucous membranes are not pale and not dry.     Pharynx: Uvula midline.     Comments: Cobblestoning in the posterior oropharynx noted.  Eyes:     General: Lids are normal. No allergic shiner.       Right eye: No discharge.        Left eye: No discharge.     Conjunctiva/sclera: Conjunctivae normal.     Right eye: Right conjunctiva is not injected. No chemosis.    Left eye: Left conjunctiva is not injected. No chemosis.    Pupils: Pupils are equal, round, and reactive to light.  Cardiovascular:     Rate and Rhythm: Normal rate and regular rhythm.     Heart sounds: Normal heart sounds.  Pulmonary:     Effort: Pulmonary effort is normal. No tachypnea, accessory muscle usage or respiratory distress.     Breath sounds: Normal breath sounds. No wheezing, rhonchi or rales.     Comments: Moving air well in  all lung fields.  No increased work of breathing. Chest:     Chest wall: No tenderness.  Lymphadenopathy:     Cervical: No cervical adenopathy.  Skin:    General: Skin is warm.     Capillary Refill: Capillary refill takes less than 2 seconds.     Coloration: Skin is not pale.     Findings: No abrasion, erythema, petechiae or rash. Rash is not papular, urticarial or vesicular.     Comments: She does have some erythema on her face bilaterally, but most prominently around her right eye. There are excoriations present.   Neurological:     Mental Status: She is alert.  Psychiatric:        Behavior: Behavior is cooperative.      Diagnostic studies:    Spirometry: results normal (FEV1: 2.57/82%, FVC: 3.14/81%, FEV1/FVC: 82%).    Spirometry consistent with normal pattern.   Allergy Studies: none       Malachi Bonds, MD  [facility]

## 2023-09-21 NOTE — Patient Instructions (Addendum)
1. Mild persistent asthma, uncomplicated - I think we are on the right track.  - Lung testing looks MUCH better than the last time.  - Daily controller medication(s): Asmanex two puffs twice daily with spacer and Singulair (montelukast) 10 mg daily  - Prior to physical activity: albuterol 2 puffs 10-15 minutes before physical activity. - Rescue medications: albuterol 4 puffs every 4-6 hours as needed or albuterol nebulizer one vial every 4-6 hours as needed - Asthma control goals:  * Full participation in all desired activities (may need albuterol before activity) * Albuterol use two time or less a week on average (not counting use with activity) * Cough interfering with sleep two time or less a month * Oral steroids no more than once a year * No hospitalizations  2. Seasonal and perennial allergic rhinitis (grasses, ragweed, weeds, trees, indoor molds, outdoor molds, dust mites, cat, dog and cockroach) - Continue with: Xyzal 5mg  (can use twice daily) AS NEEDED - Continue with allergy shots at the same schedule.  - Use the HEPA filter in the bedroom and make sure that you change the filter. - Maintenance was reached in May 2023, so we  continue with at least 3 years (May 2026) or up to 5 years (May 2028).  3. 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   4. GERD  - Continue with your anti reflux precautions.   5. Return in about 1 year (around 09/20/2024). You can have the follow up appointment with Dr. Dellis Anes or a Nurse Practicioner (our Nurse Practitioners are excellent and always have Physician oversight!).    Please inform us of any Emergency Department visits, hospitalizations, or changes in symptoms. Call us before going to the ED for breathing or allergy symptoms since we might be able to fit you in for a sick visit. Feel free to contact us anytime with any questions, problems, or concerns.  It was a pleasure to see you again today!  Websites that have  reliable patient information: 1. American Academy of Asthma, Allergy, and Immunology: www.aaaai.org 2. Food Allergy Research and Education (FARE): foodallergy.org 3. Mothers of Asthmatics: http://www.asthmacommunitynetwork.org 4. American College of Allergy, Asthma, and Immunology: www.acaai.org      "Like" [place] on Facebook and Instagram for our latest updates!      A healthy democracy works best when [employer] participate! Make sure you are registered to vote! If you have moved or changed any of your contact information, you will need to get this updated before voting! Scan the QR codes below to learn more!

## 2023-09-24 ENCOUNTER — Ambulatory Visit: Payer: BC Managed Care – PPO

## 2023-09-24 ENCOUNTER — Ambulatory Visit
Admission: RE | Admit: 2023-09-24 | Discharge: 2023-09-24 | Disposition: A | Discharge status: Home / Self Care | Payer: BC Managed Care – PPO | Source: Ambulatory Visit | Attending: Nurse Practitioner | Admitting: Nurse Practitioner

## 2023-09-24 VITALS — BP 146/90 | HR 74 | Temp 98.1°F | Resp 19

## 2023-09-24 DIAGNOSIS — S6000XA Contusion of unspecified finger without damage to nail, initial encounter: Secondary | ICD-10-CM

## 2023-09-24 DIAGNOSIS — M1811 Unilateral primary osteoarthritis of first carpometacarpal joint, right hand: Secondary | ICD-10-CM | POA: Diagnosis not present

## 2023-09-24 DIAGNOSIS — M19041 Primary osteoarthritis, right hand: Secondary | ICD-10-CM | POA: Diagnosis not present

## 2023-09-24 NOTE — ED Provider Notes (Signed)
[facility]    CSN: [id number] Arrival date & time: 09/24/23  1520      History   Chief Complaint Chief Complaint  Patient presents with   Finger Injury    Want to know if broken or not. - Entered by patient    HPI Kristina Garza is a 47 y.o. female.   The history is provided by the patient.   Patient presents with injuries to the right middle finger, ring finger, and small finger after her fingers got caught in a mixer 1 day ago.  Patient states that she did have pain and swelling to the fingers.  She states that she is now able to move the middle finger and small finger more easily today.  She states that she continues to have moderate pain and swelling and decreased range of motion in the right ring finger.  Patient denies numbness, tingling, or radiation of pain.  Reports that she is left-hand dominant.  She has applied ice to the right hand. 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      Home Medications    Prior to Admission medications   Medication Sig Start Date End Date Taking? Authorizing Provider  albuterol (PROAIR HFA) 108 (90 Base) MCG/ACT inhaler Inhale 2 puffs into the lungs every  6 (six) hours as needed for wheezing or shortness of breath. 09/07/21   Alfonse Spruce, MD  albuterol (PROVENTIL) (2.5 MG/3ML) 0.083% nebulizer solution Take 3 mLs (2.5 mg total) by nebulization every 6 (six) hours as needed for wheezing or shortness of breath. 12/20/21   Alfonse Spruce, MD  EPINEPHrine 0.3 mg/0.3 mL IJ SOAJ injection Use as directed for anaphylactic reactions 09/21/23   Alfonse Spruce, MD  levocetirizine (XYZAL) 5 MG tablet Take 1 tablet (5 mg total) by mouth every evening. 06/13/23   Alfonse Spruce, MD  Mometasone Furoate [facility] HFA) 100 MCG/ACT AERO INHALE 2 PUFFS BY MOUTH TWICE DAILY 09/21/23   Alfonse Spruce, MD  montelukast (SINGULAIR) 10 MG tablet Take 1 tablet (10 mg total) by mouth at bedtime. 09/21/23   Alfonse Spruce, MD  nystatin-triamcinolone [facility] II) cream Apply to rash under the breast QD up to 10 days. Avoid applying to face, groin, and axilla. Use as directed. Long-term use can cause thinning of the skin. Patient not taking: Reported on 09/21/2023 04/30/23   Terri Piedra, DO  triamcinolone cream (KENALOG) 0.1 % Apply to affected area 1-2 times daily 07/28/20   Jarold Motto, PA    Family History Family History  Problem Relation Age of  Onset   Asthma Mother    Osteoarthritis Mother    Arthritis Mother    Hyperlipidemia Father    Hypertension Father    Heart attack Father    Sleep apnea Father    Heart disease Father    Prostate cancer Father    Hyperlipidemia Paternal Grandmother    Hypertension Paternal Grandmother    Heart attack Paternal Grandmother    Heart disease Paternal Grandmother    Breast cancer Neg Hx    Colon cancer Neg Hx    Stomach cancer Neg Hx    Esophageal cancer Neg Hx    Pancreatic cancer Neg Hx     Social History Social History   Tobacco Use   Smoking status: Never    Passive exposure: Never   Smokeless tobacco: Never  Vaping Use   Vaping status: Never Used  Substance  Use Topics   Alcohol use: Yes    Comment: 1 a month maybe   Drug use: Never     Allergies   Pollen extract   Review of Systems Review of Systems Per HPI  Physical Exam Triage Vital Signs ED Triage Vitals  Encounter Vitals Group     BP 09/24/23 1537 (!) 146/90     Systolic BP Percentile --      Diastolic BP Percentile --      Pulse Rate 09/24/23 1537 74     Resp 09/24/23 1537 19     Temp 09/24/23 1537 98.1 F (36.7 C)     Temp Source 09/24/23 1537 Oral     SpO2 09/24/23 1537 97 %     Weight --      Height --      Head Circumference --      Peak Flow --      Pain Score 09/24/23 1541 6     Pain Loc --      Pain Education --      Exclude from Growth Chart --    No data found.  Updated Vital Signs BP (!) 146/90 (BP Location: Right Arm)   Pulse 74   Temp 98.1 F (36.7 C) (Oral)   Resp 19   LMP 09/06/2023 (Within Days)   SpO2 97%   Visual Acuity Right Eye Distance:   Left Eye Distance:   Bilateral Distance:    Right Eye Near:   Left Eye Near:    Bilateral Near:     Physical Exam Vitals and nursing note reviewed.  Constitutional:      General: She is not in acute distress.    Appearance: Normal appearance.  HENT:     Head: Normocephalic.  Eyes:     Extraocular Movements: Extraocular movements intact.     Pupils: Pupils are equal, round, and reactive to light.  Pulmonary:     Effort: Pulmonary effort is normal.  Musculoskeletal:     Right hand: Swelling (Swelling to the PIP joint of the right ring finger.) and tenderness (PIP joint of right ring finger) present. Decreased range of motion (right 3rd through 5th digits). Decreased strength (d/t pain). Normal sensation. Normal capillary refill. Normal pulse.     Comments: Bruising noted the palmar side of the right middle finger at the PIP joint.   Skin:    General: Skin is warm and dry.  Neurological:     General: No focal deficit present.     Mental Status: She is alert and oriented to person, place,  and time.  Psychiatric:  Mood and Affect: Mood normal.        Behavior: Behavior normal.      UC Treatments / Results  Labs (all labs ordered are listed, but only abnormal results are displayed) Labs Reviewed - No data to display  EKG   Radiology DG Hand Complete Right Result Date: 09/24/2023 CLINICAL DATA:  Finger injury yesterday while cooking. EXAM: RIGHT HAND - COMPLETE 3+ VIEW COMPARISON:  None Available. FINDINGS: No acute fracture or dislocation. Mild degenerative changes of the first [place] joint. Minimal degenerative changes of the MCP and IP joints. Bone mineralization is normal. Soft tissues are unremarkable. No radiopaque foreign body identified. IMPRESSION: 1. No acute osseous abnormality. Electronically Signed   By: Obie Dredge M.D.   On: 09/24/2023 16:58    Procedures Procedures (including critical care time)  Medications Ordered in UC Medications - No data to display  Initial Impression / Assessment and Plan / UC Course  I have reviewed the triage vital signs and the nursing notes.  Pertinent labs & imaging results that were available during my care of the patient were reviewed by me and considered in my medical decision making (see chart for details).  X-ray of the right hand is pending.  Splint provided to the right ring finger to allow for additional compression and support as well as stabilization.  Patient was advised she will be contacted if the pending x-ray result is abnormal.  Supportive care recommendations were provided and discussed with the patient to include RICE therapy and use of over-the-counter analgesics.  Discussed indications with the patient regarding when follow-up be indicated.  Patient was in agreement with this plan of care and verbalized understanding.  All questions were answered.  Patient stable for discharge.  Final Clinical Impressions(s) / UC Diagnoses   Final diagnoses:  Contusion of multiple fingers     Discharge  Instructions      X-ray of the right hand is pending.  You will be contacted if the x-ray result is abnormal.  You will also have access to the results via MyChart. May take over-the-counter Tylenol or ibuprofen as needed for pain or discomfort. A splint has been provided to allow for additional compression and support.  Wear the splint when you are engaged in prolonged or strenuous activity.  You may remove the splint at bedtime.  RICE therapy, rest, ice, compression, and elevation.  Apply ice for 20 minutes, remove for 1 hour, repeat as needed to help with pain and swelling. Gentle stretching and bending of the fingers on the right hand to help decrease recovery time. If your x-ray is negative for fracture or dislocation, you should notice improvement over the next 1 to 2 weeks.  If you continue to experience symptoms despite a negative x-ray, would like for you to follow-up with orthopedics. If your x-ray does show a fracture or dislocation of the finger, please follow-up with orthopedics within the next 48 to 72 hours for reevaluation. Follow-up as needed.     ED Prescriptions   None    PDMP not reviewed this encounter.   Abran Cantor, NP 09/24/23 1709

## 2023-09-24 NOTE — Discharge Instructions (Addendum)
X-ray of the right hand is pending.  You will be contacted if the x-ray result is abnormal.  You will also have access to the results via MyChart. May take over-the-counter Tylenol or ibuprofen as needed for pain or discomfort. A splint has been provided to allow for additional compression and support.  Wear the splint when you are engaged in prolonged or strenuous activity.  You may remove the splint at bedtime.  RICE therapy, rest, ice, compression, and elevation.  Apply ice for 20 minutes, remove for 1 hour, repeat as needed to help with pain and swelling. Gentle stretching and bending of the fingers on the right hand to help decrease recovery time. If your x-ray is negative for fracture or dislocation, you should notice improvement over the next 1 to 2 weeks.  If you continue to experience symptoms despite a negative x-ray, would like for you to follow-up with orthopedics. If your x-ray does show a fracture or dislocation of the finger, please follow-up with orthopedics within the next 48 to 72 hours for reevaluation. Follow-up as needed.

## 2023-09-24 NOTE — ED Triage Notes (Signed)
Pt reports injury to finger on right hand, states she got her fingers caught in her mixer yesterday, her pinky finger, ring finger, and middle finger we all injured. Recalls being able to move both the pinky and middle fingers but is having difficulty moving the ring finger.

## 2023-09-26 ENCOUNTER — Ambulatory Visit (INDEPENDENT_AMBULATORY_CARE_PROVIDER_SITE_OTHER): Payer: Self-pay

## 2023-09-26 DIAGNOSIS — J309 Allergic rhinitis, unspecified: Secondary | ICD-10-CM | POA: Diagnosis not present

## 2023-10-01 ENCOUNTER — Telehealth (INDEPENDENT_AMBULATORY_CARE_PROVIDER_SITE_OTHER): Payer: BC Managed Care – PPO | Admitting: Adult Health

## 2023-10-01 ENCOUNTER — Encounter: Payer: Self-pay | Admitting: Anesthesiology

## 2023-10-01 DIAGNOSIS — G4733 Obstructive sleep apnea (adult) (pediatric): Secondary | ICD-10-CM

## 2023-10-01 NOTE — Progress Notes (Signed)
PATIENT: Kristina Garza DOB: August 18, 1976  REASON FOR VISIT: follow up HISTORY FROM: patient   Virtual Visit via Video Note  I connected with Kristina Garza on 10/01/23 at 11:45 AM EST by a video enabled telemedicine application located remotely at [facility] and verified that I am speaking with the correct person using two identifiers who was located at their own home.   I discussed the limitations of evaluation and management by telemedicine and the availability of in person appointments. The patient expressed understanding and agreed to proceed.   PATIENT: Kristina Garza DOB: April 14, 1976  REASON FOR VISIT: follow up HISTORY FROM: patient  HISTORY OF PRESENT ILLNESS: Today 10/01/23:  Kristina Garza is a 47 y.o. female with a history of obstructive sleep apnea on CPAP. Returns today for follow-up.  She reports that she has not been able to use the CPAP consistently because she got COVID and had severe congestion.  She is now feeling better and is trying to use it more consistently.  She states that she pay for this machine out right because she was told her insurance would not cover it.  Not sure how she is supposed to get supplies or she has to pay out-of-pocket now.  Download is below      REVIEW OF SYSTEMS: Out of a complete 14 system review of symptoms, the patient complains only of the following symptoms, and all other reviewed systems are negative.  ALLERGIES: Allergies  Allergen Reactions   Pollen Extract Cough and Shortness Of Breath    HOME MEDICATIONS: Outpatient Medications Prior to Visit  Medication Sig Dispense Refill   albuterol (PROAIR HFA) 108 (90 Base) MCG/ACT inhaler Inhale 2 puffs into the lungs every 6 (six) hours as needed for wheezing or shortness of breath. 54 g 1   albuterol (PROVENTIL) (2.5 MG/3ML) 0.083% nebulizer solution Take 3 mLs (2.5 mg total) by nebulization every 6 (six) hours as needed for wheezing or shortness of breath. 225 mL 1    EPINEPHrine 0.3 mg/0.3 mL IJ SOAJ injection Use as directed for anaphylactic reactions 1 each 1   levocetirizine (XYZAL) 5 MG tablet Take 1 tablet (5 mg total) by mouth every evening. 90 tablet 2   Mometasone Furoate (ASMANEX HFA) 100 MCG/ACT AERO INHALE 2 PUFFS BY MOUTH TWICE DAILY 13 g 11   montelukast (SINGULAIR) 10 MG tablet Take 1 tablet (10 mg total) by mouth at bedtime. 30 tablet 11   nystatin-triamcinolone ([facility]COLOG II) cream Apply to rash under the breast QD up to 10 days. Avoid applying to face, groin, and axilla. Use as directed. Long-term use can cause thinning of the skin. (Patient not taking: Reported on 09/21/2023) 30 g 0   triamcinolone cream (KENALOG) 0.1 % Apply to affected area 1-2 times daily 30 g 0   No facility-administered medications prior to visit.    PAST MEDICAL HISTORY: Past Medical History:  Diagnosis Date   Allergy    Anemia    As a teenager - not recently   Asthma    Dysplastic nevus 04/30/2023   Left lower back - Moderate   GERD (gastroesophageal reflux disease)    Plantar fasciitis     PAST SURGICAL HISTORY: Past Surgical History:  Procedure Laterality Date   FOOT SURGERY Left 2007   5th Metatarsal broken pin placed   FRACTURE SURGERY  2006   Left Foot 5th Metatarsal Pin   Knee surgery Right 2016   miniscus removed    FAMILY HISTORY: Family  History  Problem Relation Age of Onset   Asthma Mother    Osteoarthritis Mother    Arthritis Mother    Hyperlipidemia Father    Hypertension Father    Heart attack Father    Sleep apnea Father    Heart disease Father    Prostate cancer Father    Hyperlipidemia Paternal Grandmother    Hypertension Paternal Grandmother    Heart attack Paternal Grandmother    Heart disease Paternal Grandmother    Breast cancer Neg Hx    Colon cancer Neg Hx    Stomach cancer Neg Hx    Esophageal cancer Neg Hx    Pancreatic cancer Neg Hx     SOCIAL HISTORY: Social History   Socioeconomic History   Marital  status: Married    Spouse name: Not on file   Number of children: Not on file   Years of education: Not on file   Highest education level: Not on file  Occupational History   Not on file  Tobacco Use   Smoking status: Never    Passive exposure: Never   Smokeless tobacco: Never  Vaping Use   Vaping status: Never Used  Substance and Sexual Activity   Alcohol use: Yes    Comment: 1 a month maybe   Drug use: Never   Sexual activity: Yes    Birth control/protection: None  Other Topics Concern   Not on file  Social History Narrative   Married   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 Follows all commands speech and language fluent Cranial nerve II-XII: Facial symmetry noted DIAGNOSTIC DATA (LABS, IMAGING, TESTING) - I reviewed patient records, labs, notes, testing and imaging myself where available.  Lab Results  Component Value Date   WBC 7.1 12/21/2022   HGB 13.6 12/21/2022   HCT 40.0 12/21/2022   MCV 90.4 12/21/2022   PLT 279.0 12/21/2022      Component Value Date/Time   NA 137 12/21/2022 0838   K 4.0 12/21/2022 0838   CL 102 12/21/2022 0838   CO2 27 12/21/2022 0838   GLUCOSE 108 (H) 12/21/2022 0838   BUN 11 12/21/2022 0838   CREATININE 0.72 12/21/2022 0838   CREATININE 0.75 08/31/2020 0848   CALCIUM 8.9 12/21/2022 0838   PROT 6.8 12/21/2022 0838   ALBUMIN 3.8 12/21/2022 0838   AST 12 12/21/2022 0838   ALT 14 12/21/2022 0838   ALKPHOS 71 12/21/2022 0838   BILITOT  0.6 12/21/2022 0838   Lab Results  Component Value Date   CHOL 145 12/21/2022   HDL 36.20 (L) 12/21/2022   LDLCALC 79 12/21/2022   TRIG 150.0 (H) 12/21/2022   CHOLHDL 4 12/21/2022   Lab Results  Component Value Date   HGBA1C 6.0 12/21/2022   No results found for: "VITAMINB12" Lab Results  Component Value Date   TSH 2.09 12/21/2022      ASSESSMENT AND PLAN 47 y.o. year  old female  has a past medical history of Allergy, Anemia, Asthma, Dysplastic nevus (04/30/2023), GERD (gastroesophageal reflux disease), and Plantar fasciitis. here with:  OSA on CPAP  CPAP compliance suboptimal Residual AHI is good when she used the machine Encouraged patient to continue using CPAP nightly and > 4 hours each night Will have my nurse reach out to her DME company to find out about her denial. F/U in 1 year or sooner if needed   Butch Penny, MSN, NP-C 10/01/2023, 11:56 AM [facility] [address], [address] [place], [place] [address] [phone]

## 2023-10-02 ENCOUNTER — Telehealth: Payer: Self-pay | Admitting: Neurology

## 2023-10-02 NOTE — Telephone Encounter (Signed)
Kristina Garza with [employer] returning call. Requesting call back [phone]

## 2023-10-02 NOTE — Telephone Encounter (Signed)
Returned call to [employer] who stated she needed ahi was only 3.82% and criteria for insurance must be greater than 5%. Cannot base on REM alone.

## 2023-10-12 ENCOUNTER — Ambulatory Visit (INDEPENDENT_AMBULATORY_CARE_PROVIDER_SITE_OTHER): Payer: Self-pay

## 2023-10-12 DIAGNOSIS — J309 Allergic rhinitis, unspecified: Secondary | ICD-10-CM | POA: Diagnosis not present

## 2023-10-15 ENCOUNTER — Telehealth: Payer: Self-pay | Admitting: *Deleted

## 2023-10-15 NOTE — Telephone Encounter (Signed)
 I called [employer].  I relayed request from provider about this.  Kristina Garza did speak to them on 10-02-2023 whom stated she needed ahi was only 3.82% and criteria for insurance must be greater than 5%. Cannot base on REM alone.  Pt was aware of insurance not approving and cost, supplies would not be covered as well.

## 2023-10-15 NOTE — Telephone Encounter (Signed)
-----   Message from [facility] sent at 10/01/2023 11:59 AM EST ----- Can we reach out to the DME company and see if they did an appeal when her CPAP was denied.  She paid out-of-pocket but now she is going to be forced to have to buy all of her supplies.  She was also at the fitting when they told her that it was not can to be covered.  I feel this is a little inappropriate that she was not advised ahead of time.

## 2023-10-15 NOTE — Telephone Encounter (Signed)
 The sleep study showed AHI of 5.9/hour, REM AHI of 33.2/hour. can appeal be done due to REM AHI

## 2023-10-15 NOTE — Telephone Encounter (Signed)
 DME: [facility] Ph: [phone] Fax: [phone]

## 2023-10-15 NOTE — Telephone Encounter (Signed)
 I faxed to them the sleep study also the last phone message from provider.  [phone] [employer]. Received. 12 pages.

## 2023-10-19 ENCOUNTER — Ambulatory Visit (INDEPENDENT_AMBULATORY_CARE_PROVIDER_SITE_OTHER): Payer: BC Managed Care – PPO

## 2023-10-19 DIAGNOSIS — J309 Allergic rhinitis, unspecified: Secondary | ICD-10-CM

## 2023-11-14 ENCOUNTER — Ambulatory Visit (INDEPENDENT_AMBULATORY_CARE_PROVIDER_SITE_OTHER): Payer: Self-pay

## 2023-11-14 DIAGNOSIS — J309 Allergic rhinitis, unspecified: Secondary | ICD-10-CM | POA: Diagnosis not present

## 2023-12-12 ENCOUNTER — Ambulatory Visit (INDEPENDENT_AMBULATORY_CARE_PROVIDER_SITE_OTHER): Payer: Self-pay

## 2023-12-12 DIAGNOSIS — J309 Allergic rhinitis, unspecified: Secondary | ICD-10-CM

## 2024-01-09 ENCOUNTER — Ambulatory Visit (INDEPENDENT_AMBULATORY_CARE_PROVIDER_SITE_OTHER): Payer: Self-pay

## 2024-01-09 DIAGNOSIS — J309 Allergic rhinitis, unspecified: Secondary | ICD-10-CM

## 2024-01-17 ENCOUNTER — Telehealth: Payer: Self-pay | Admitting: Allergy & Immunology

## 2024-01-17 NOTE — Telephone Encounter (Signed)
 Patient called and requested for her Albuterol Inhaler to be refilled and sent over to [facility] on [address] in [place].  Patient also requested if a nurse could call her back as her allergies have been getting really bad ant wanted to know if she is doing the proper protocol. Patient would like a call back if possible.   Best Contact: [phone]

## 2024-01-18 MED ORDER — ALBUTEROL SULFATE HFA 108 (90 BASE) MCG/ACT IN AERS: 2.0000 | INHALATION_SPRAY | Freq: Four times a day (QID) | RESPIRATORY_TRACT | 1 refills | Status: DC | PRN

## 2024-01-18 NOTE — Addendum Note (Signed)
 Addended by: Floydene Flock C on: 01/18/2024 09:22 AM   Modules accepted: Orders

## 2024-01-18 NOTE — Telephone Encounter (Signed)
 Rx sent. Called patient and left vm requesting a call back to discuss protocol.

## 2024-02-06 ENCOUNTER — Ambulatory Visit (INDEPENDENT_AMBULATORY_CARE_PROVIDER_SITE_OTHER)

## 2024-02-06 DIAGNOSIS — J309 Allergic rhinitis, unspecified: Secondary | ICD-10-CM | POA: Diagnosis not present

## 2024-02-20 DIAGNOSIS — J3081 Allergic rhinitis due to animal (cat) (dog) hair and dander: Secondary | ICD-10-CM | POA: Diagnosis not present

## 2024-02-20 NOTE — Progress Notes (Signed)
 VIALS MADE 02-20-24

## 2024-02-21 DIAGNOSIS — J3089 Other allergic rhinitis: Secondary | ICD-10-CM | POA: Diagnosis not present

## 2024-03-05 ENCOUNTER — Ambulatory Visit (INDEPENDENT_AMBULATORY_CARE_PROVIDER_SITE_OTHER): Payer: Self-pay

## 2024-03-05 DIAGNOSIS — J309 Allergic rhinitis, unspecified: Secondary | ICD-10-CM | POA: Diagnosis not present

## 2024-04-02 ENCOUNTER — Ambulatory Visit (INDEPENDENT_AMBULATORY_CARE_PROVIDER_SITE_OTHER): Payer: Self-pay

## 2024-04-02 DIAGNOSIS — J309 Allergic rhinitis, unspecified: Secondary | ICD-10-CM

## 2024-05-02 ENCOUNTER — Ambulatory Visit (INDEPENDENT_AMBULATORY_CARE_PROVIDER_SITE_OTHER): Payer: Self-pay

## 2024-05-02 DIAGNOSIS — J309 Allergic rhinitis, unspecified: Secondary | ICD-10-CM

## 2024-05-12 ENCOUNTER — Ambulatory Visit: Payer: BC Managed Care – PPO | Admitting: Dermatology

## 2024-05-13 ENCOUNTER — Ambulatory Visit: Payer: BC Managed Care – PPO | Admitting: Dermatology

## 2024-05-26 ENCOUNTER — Encounter: Payer: Self-pay | Admitting: *Deleted

## 2024-05-27 ENCOUNTER — Telehealth: Payer: BC Managed Care – PPO | Admitting: Adult Health

## 2024-05-27 DIAGNOSIS — G4733 Obstructive sleep apnea (adult) (pediatric): Secondary | ICD-10-CM | POA: Diagnosis not present

## 2024-05-27 NOTE — Progress Notes (Signed)
 PATIENT: Kristina Garza DOB: November 16, 1975  REASON FOR VISIT: follow up HISTORY FROM: patient   Virtual Visit via Video Note  I connected with Kristina Garza on 05/27/24 at  2:00 PM EDT by a video enabled telemedicine application located remotely at [facility] and verified that I am speaking with the correct person using two identifiers who was located at their own home.   I discussed the limitations of evaluation and management by telemedicine and the availability of in person appointments. The patient expressed understanding and agreed to proceed.   PATIENT: Kristina Garza DOB: 07-31-1976  REASON FOR VISIT: follow up HISTORY FROM: patient  HISTORY OF PRESENT ILLNESS: Today 05/27/24:  Kristina Garza is a 48 y.o. female with a history of OSA on CPAP. Returns today for follow-up.  Reports that CPAP has been beneficial.  She has finally gotten used to it and can tell a difference when she does not use it.  Unfortunately her DME company did not cover the cost of the CPAP machine due to insurance requirements per them.  She is also to pay out-of-pocket for her CPAP supplies.     10/01/23: Kristina Garza is a 48 y.o. female with a history of obstructive sleep apnea on CPAP. Returns today for follow-up.  She reports that she has not been able to use the CPAP consistently because she got COVID and had severe congestion.  She is now feeling better and is trying to use it more consistently.  She states that she pay for this machine out right because she was told her insurance would not cover it.  Not sure how she is supposed to get supplies or she has to pay out-of-pocket now.  Download is below      REVIEW OF SYSTEMS: Out of a complete 14 system review of symptoms, the patient complains only of the following symptoms, and all other reviewed systems are negative.  ALLERGIES: Allergies  Allergen Reactions   Pollen Extract Cough and Shortness Of Breath    HOME  MEDICATIONS: Outpatient Medications Prior to Visit  Medication Sig Dispense Refill   albuterol  (PROAIR  HFA) 108 (90 Base) MCG/ACT inhaler Inhale 2 puffs into the lungs every 6 (six) hours as needed for wheezing or shortness of breath. 54 g 1   albuterol  (PROVENTIL ) (2.5 MG/3ML) 0.083% nebulizer solution Take 3 mLs (2.5 mg total) by nebulization every 6 (six) hours as needed for wheezing or shortness of breath. 225 mL 1   EPINEPHrine  0.3 mg/0.3 mL IJ SOAJ injection Use as directed for anaphylactic reactions 1 each 1   levocetirizine (XYZAL ) 5 MG tablet Take 1 tablet (5 mg total) by mouth every evening. 90 tablet 2   Mometasone Furoate  (ASMANEX  HFA) 100 MCG/ACT AERO INHALE 2 PUFFS BY MOUTH TWICE DAILY 13 g 11   montelukast  (SINGULAIR ) 10 MG tablet Take 1 tablet (10 mg total) by mouth at bedtime. 30 tablet 11   nystatin -triamcinolone  (MYCOLOG II) cream Apply to rash under the breast QD up to 10 days. Avoid applying to face, groin, and axilla. Use as directed. Long-term use can cause thinning of the skin. (Patient not taking: Reported on 09/21/2023) 30 g 0   triamcinolone  cream (KENALOG ) 0.1 % Apply to affected area 1-2 times daily 30 g 0   No facility-administered medications prior to visit.    PAST MEDICAL HISTORY: Past Medical History:  Diagnosis Date   Allergy    Anemia    As a teenager - not recently  Asthma    Dysplastic nevus 04/30/2023   Left lower back - Moderate   GERD (gastroesophageal reflux disease)    Plantar fasciitis     PAST SURGICAL HISTORY: Past Surgical History:  Procedure Laterality Date   FOOT SURGERY Left 2007   5th Metatarsal broken pin placed   FRACTURE SURGERY  2006   Left Foot 5th Metatarsal Pin   Knee surgery Right 2016   miniscus removed    FAMILY HISTORY: Family History  Problem Relation Age of Onset   Asthma Mother    Osteoarthritis Mother    Arthritis Mother    Hyperlipidemia Father    Hypertension Father    Heart attack Father    Sleep  apnea Father    Heart disease Father    Prostate cancer Father    Hyperlipidemia Paternal Grandmother    Hypertension Paternal Grandmother    Heart attack Paternal Grandmother    Heart disease Paternal Grandmother    Breast cancer Neg Hx    Colon cancer Neg Hx    Stomach cancer Neg Hx    Esophageal cancer Neg Hx    Pancreatic cancer Neg Hx     SOCIAL HISTORY: Social History   Socioeconomic History   Marital status: Married    Spouse name: Not on file   Number of children: Not on file   Years of education: Not on file   Highest education level: Not on file  Occupational History   Not on file  Tobacco Use   Smoking status: Never    Passive exposure: Never   Smokeless tobacco: Never  Vaping Use   Vaping status: Never Used  Substance and Sexual Activity   Alcohol use: Yes    Comment: 1 a month maybe   Drug use: Never   Sexual activity: Yes    Birth control/protection: None  Other Topics Concern   Not on file  Social History Narrative   Married   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 Follows all commands speech and language fluent Cranial nerve II-XII: Facial symmetry noted DIAGNOSTIC DATA (LABS, IMAGING, TESTING) - I reviewed patient records, labs, notes, testing and imaging myself where available.  Lab Results  Component Value Date   WBC 7.1 12/21/2022   HGB 13.6 12/21/2022   HCT 40.0 12/21/2022   MCV 90.4 12/21/2022   PLT 279.0 12/21/2022      Component  Value Date/Time   NA 137 12/21/2022 0838   K 4.0 12/21/2022 0838   CL 102 12/21/2022 0838   CO2 27 12/21/2022 0838   GLUCOSE 108 (H) 12/21/2022 0838   BUN 11 12/21/2022 0838   CREATININE 0.72 12/21/2022 0838   CREATININE 0.75 08/31/2020 0848   CALCIUM 8.9 12/21/2022 0838   PROT 6.8 12/21/2022 0838   ALBUMIN 3.8 12/21/2022 0838   AST 12 12/21/2022 0838   ALT 14 12/21/2022 0838   ALKPHOS 71 12/21/2022 9161  BILITOT 0.6 12/21/2022 0838   Lab Results  Component Value Date   CHOL 145 12/21/2022   HDL 36.20 (L) 12/21/2022   LDLCALC 79 12/21/2022   TRIG 150.0 (H) 12/21/2022   CHOLHDL 4 12/21/2022   Lab Results  Component Value Date   HGBA1C 6.0 12/21/2022   No results found for: VITAMINB12 Lab Results  Component Value Date   TSH 2.09 12/21/2022      ASSESSMENT AND PLAN 48 y.o. year old female  has a past medical history of Allergy, Anemia, Asthma, Dysplastic nevus (04/30/2023), GERD (gastroesophageal reflux disease), and Plantar fasciitis. here with:  OSA on CPAP  CPAP compliance good Residual AHI is good when she used the machine Encouraged patient to continue using CPAP nightly and > 4 hours each night F/U in 1 year or sooner if needed   Duwaine Russell, MSN, NP-C 05/27/2024, 2:10 PM [facility] [address] [place], [place] [address] [phone]  The patient's condition requires frequent monitoring and adjustments in the treatment plan, reflecting the ongoing complexity of care.  This provider is the continuing focal point for all needed services for this condition.

## 2024-05-30 ENCOUNTER — Ambulatory Visit (INDEPENDENT_AMBULATORY_CARE_PROVIDER_SITE_OTHER)

## 2024-05-30 DIAGNOSIS — J309 Allergic rhinitis, unspecified: Secondary | ICD-10-CM | POA: Diagnosis not present

## 2024-06-06 ENCOUNTER — Ambulatory Visit (INDEPENDENT_AMBULATORY_CARE_PROVIDER_SITE_OTHER): Payer: Self-pay

## 2024-06-06 DIAGNOSIS — J309 Allergic rhinitis, unspecified: Secondary | ICD-10-CM | POA: Diagnosis not present

## 2024-06-11 ENCOUNTER — Ambulatory Visit (INDEPENDENT_AMBULATORY_CARE_PROVIDER_SITE_OTHER): Payer: Self-pay

## 2024-06-11 DIAGNOSIS — J309 Allergic rhinitis, unspecified: Secondary | ICD-10-CM | POA: Diagnosis not present

## 2024-06-13 DIAGNOSIS — H10411 Chronic giant papillary conjunctivitis, right eye: Secondary | ICD-10-CM | POA: Diagnosis not present

## 2024-07-09 ENCOUNTER — Ambulatory Visit: Payer: Self-pay

## 2024-07-09 DIAGNOSIS — J309 Allergic rhinitis, unspecified: Secondary | ICD-10-CM

## 2024-07-20 ENCOUNTER — Other Ambulatory Visit: Payer: Self-pay | Admitting: Allergy & Immunology

## 2024-08-06 ENCOUNTER — Ambulatory Visit (INDEPENDENT_AMBULATORY_CARE_PROVIDER_SITE_OTHER): Payer: Self-pay

## 2024-08-06 DIAGNOSIS — J309 Allergic rhinitis, unspecified: Secondary | ICD-10-CM

## 2024-09-03 ENCOUNTER — Ambulatory Visit (INDEPENDENT_AMBULATORY_CARE_PROVIDER_SITE_OTHER)

## 2024-09-03 DIAGNOSIS — J309 Allergic rhinitis, unspecified: Secondary | ICD-10-CM | POA: Diagnosis not present

## 2024-09-24 ENCOUNTER — Encounter: Payer: Self-pay | Admitting: Allergy & Immunology

## 2024-09-24 ENCOUNTER — Ambulatory Visit

## 2024-09-24 ENCOUNTER — Other Ambulatory Visit: Payer: Self-pay

## 2024-09-24 ENCOUNTER — Ambulatory Visit: Payer: BC Managed Care – PPO | Admitting: Allergy & Immunology

## 2024-09-24 VITALS — BP 124/80 | HR 76 | Temp 97.6°F | Ht 67.13 in | Wt 274.2 lb

## 2024-09-24 DIAGNOSIS — J3089 Other allergic rhinitis: Secondary | ICD-10-CM

## 2024-09-24 DIAGNOSIS — J302 Other seasonal allergic rhinitis: Secondary | ICD-10-CM

## 2024-09-24 DIAGNOSIS — J309 Allergic rhinitis, unspecified: Secondary | ICD-10-CM

## 2024-09-24 DIAGNOSIS — J4530 Mild persistent asthma, uncomplicated: Secondary | ICD-10-CM

## 2024-09-24 DIAGNOSIS — K219 Gastro-esophageal reflux disease without esophagitis: Secondary | ICD-10-CM

## 2024-09-24 MED ORDER — TRIAMCINOLONE ACETONIDE 0.5 % EX OINT: 1.0000 | TOPICAL_OINTMENT | Freq: Two times a day (BID) | CUTANEOUS | 2 refills | Status: AC

## 2024-09-24 MED ORDER — EPINEPHRINE 0.3 MG/0.3ML IJ SOAJ: INTRAMUSCULAR | 1 refills | Status: AC

## 2024-09-24 MED ORDER — BUDESONIDE-FORMOTEROL FUMARATE 160-4.5 MCG/ACT IN AERO: 2.0000 | INHALATION_SPRAY | Freq: Two times a day (BID) | RESPIRATORY_TRACT | 2 refills | Status: AC

## 2024-09-24 NOTE — Patient Instructions (Addendum)
 1. Mild persistent asthma, uncomplicated - Lung testing looks excellent today.  - We are going to start Symbicort  in place of Asmanex .  - Symbicort  contains a long-acting albuterol  combined with the inhaled steroid (as opposed to the inhaled steroid only with the Asmanex )  - Daily controller medication(s): Symbicort  160/4.33mcg two puffs twice daily with spacer and Singulair  (montelukast ) 10 mg daily  - Prior to physical activity: albuterol  2 puffs 10-15 minutes before physical activity. - Rescue medications: albuterol  4 puffs every 4-6 hours as needed or albuterol  nebulizer one vial every 4-6 hours as needed - Asthma control goals:  * Full participation in all desired activities (may need albuterol  before activity) * Albuterol  use two time or less a week on average (not counting use with activity) * Cough interfering with sleep two time or less a month * Oral steroids no more than once a year * No hospitalizations  2. Seasonal and perennial allergic rhinitis (grasses, ragweed, weeds, trees, indoor molds, outdoor molds, dust mites, cat, dog and cockroach) - well controlled - Continue with allergy shots at the same schedule.  - Use the HEPA filter in the bedroom and make sure that you change the filter. - Maintenance was reached in May 2023, so we continue with at least 3 years (May 2026) or up to 5 years (May 2028).  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   4. GERD  - Continue with your anti reflux precautions.   5. Return in about 3 months since we are changing medications for your asthma (around 12/23/2024). You can have the follow up appointment with Dr. Iva or a Nurse Practicioner (our Nurse Practitioners are excellent and always have Physician oversight!).    Please inform us  of any Emergency Department visits, hospitalizations, or changes in symptoms. Call us  before going to the ED for breathing or allergy symptoms since we might be able to fit you in for a  sick visit. Feel free to contact us  anytime with any questions, problems, or concerns.  It was a pleasure to see you again today!  Websites that have reliable patient information: 1. American Academy of Asthma, Allergy, and Immunology: www.aaaai.org 2. Food Allergy Research and Education (FARE): foodallergy.org 3. Mothers of Asthmatics: http://www.asthmacommunitynetwork.org 4. American College of Allergy, Asthma, and Immunology: www.acaai.org      Like us  on [employer] and Instagram for our latest updates!      A healthy democracy works best when [employer] participate! Make sure you are registered to vote! If you have moved or changed any of your contact information, you will need to get this updated before voting! Scan the QR codes below to learn more!

## 2024-09-24 NOTE — Progress Notes (Signed)
 FOLLOW UP  Date of Service/Encounter:  09/24/2024   Assessment:   Mild persistent asthma, uncomplicated   Seasonal and perennial allergic rhinitis (grasses, ragweed, weeds, trees, indoor molds, outdoor molds, dust mites, cat, dog and cockroach) - on allergen immunotherapy maintenance from May 2023   Fire ant anaphylaxis - EpiPen  up to date  Plan/Recommendations:   1. Mild persistent asthma, uncomplicated - Lung testing looks excellent today.  - We are going to start Symbicort  in place of Asmanex .  - Symbicort  contains a long-acting albuterol  combined with the inhaled steroid (as opposed to the inhaled steroid only with the Asmanex )  - Daily controller medication(s): Symbicort  160/4.53mcg two puffs twice daily with spacer and Singulair  (montelukast ) 10 mg daily  - Prior to physical activity: albuterol  2 puffs 10-15 minutes before physical activity. - Rescue medications: albuterol  4 puffs every 4-6 hours as needed or albuterol  nebulizer one vial every 4-6 hours as needed - Asthma control goals:  * Full participation in all desired activities (may need albuterol  before activity) * Albuterol  use two time or less a week on average (not counting use with activity) * Cough interfering with sleep two time or less a month * Oral steroids no more than once a year * No hospitalizations  2. Seasonal and perennial allergic rhinitis (grasses, ragweed, weeds, trees, indoor molds, outdoor molds, dust mites, cat, dog and cockroach) - well controlled - Continue with allergy shots at the same schedule.  - Use the HEPA filter in the bedroom and make sure that you change the filter. - Maintenance was reached in May 2023, so we continue with at least 3 years (May 2026) or up to 5 years (May 2028).  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   4. GERD  - Continue with your anti reflux precautions.   5. Return in about 3 months since we are changing medications for your asthma  (around 12/23/2024). You can have the follow up appointment with Dr. Iva or a Nurse Practicioner (our Nurse Practitioners are excellent and always have Physician oversight!).    Subjective:   Kristina Garza is a 48 y.o. 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  Discussed the use of AI scribe software for clinical note transcription with the patient and/or guardian, who gave verbal consent to proceed.  Kristina Garza is a 48 y.o. female presenting for a follow up visit.  She was last seen in December 2024.  At that time, we had a continue Asmanex  100 mcg 2 puffs twice daily as well as Singulair  and albuterol  as needed.  For her rhinitis, she continued with her allergy shots.  She had reached maintenance in May 2023.  For her fire ant anaphylaxis, her EpiPen  is up-to-date.  GERD was controlled without the use of medications.  Since last visit, she has done well.  Asthma/Respiratory Symptom History: She experiences difficulty with breathing, particularly when engaging in laughter, which causes her to stop  and cough. This issue was noted during a recent visit to [facility] over Thanksgiving. She uses albuterol  occasionally but is cautious about frequent use. [place]'s asthma has been well controlled. She has not required rescue medication, experienced nocturnal awakenings due to lower respiratory symptoms, nor have activities of daily living been limited. She has required no Emergency Department or Urgent  Care visits for her asthma. She has required zero courses of systemic steroids for asthma exacerbations since the last visit. ACT score today is 25, indicating excellent asthma symptom control.   Allergic Rhinitis Symptom History: She has been receiving allergy shots monthly and reports no significant problems this fall, marking the first fall without notable issues. She has not experienced any sinus infections or illnesses this fall and has not required antibiotics since having COVID last Thanksgiving. She stopped using Xyzal  after experiencing unbearable itchiness upon discontinuation in the spring. She has not used it this fall and has not had any problems  Kristina Garza is on allergen immunotherapy. She receives two injections. Immunotherapy script #1 contains trees, weeds, grasses, cat, and dog. She currently receives 0.22mL of the RED vial (1/100). Immunotherapy script #2 contains  ragweed, molds, dust mites, and cockroach. She currently receives 0.93mL of the RED vial (1/100). She started shots September of 2022 and reached maintenance in May of 2023.  Skin Symptom History: She mentions frequent exposure to poison ivy, particularly in the spring and summer, and inquires about treatment options. She has previously used triamcinolone  for rashes related to poison ivy exposure.   Otherwise, there have been no changes to her past medical history, surgical history, family history, or social history.    Review of systems otherwise negative other than that mentioned in the HPI.    Objective:   Blood pressure 124/80, pulse 76, temperature 97.6 F (36.4 C), temperature source Temporal, height 5' 7.13 (1.705 m), weight 274 lb 3.2 oz (124.4 kg), SpO2 97%. Body mass index is 42.78 kg/m.    Physical Exam Vitals reviewed.  Constitutional:      Appearance: She is well-developed.     Comments: Pleasant and talkative.   HENT:     Head: Normocephalic and atraumatic.     Right Ear: Tympanic membrane, ear  canal and external ear normal.     Left Ear: Tympanic membrane, ear canal and external ear normal.     Nose: No nasal deformity, septal deviation or rhinorrhea.     Right Turbinates: Enlarged, swollen and pale.     Left Turbinates: Enlarged, swollen and pale.     Right Sinus: No maxillary sinus tenderness or frontal sinus tenderness.     Left Sinus: No maxillary sinus tenderness or frontal sinus tenderness.     Comments: No polyps.     Mouth/Throat:     Mouth: Mucous membranes are not pale and not dry.     Pharynx: Uvula midline.     Comments: Cobblestoning in the posterior oropharynx noted.  Eyes:     General: Lids are normal. No allergic shiner.       Right eye: No discharge.        Left eye: No discharge.     Conjunctiva/sclera: Conjunctivae normal.     Right eye: Right conjunctiva is not injected. No chemosis.    Left eye: Left conjunctiva is not injected. No chemosis.    Pupils: Pupils are equal, round, and reactive to light.  Cardiovascular:     Rate and Rhythm: Normal rate and regular rhythm.  Heart sounds: Normal heart sounds.  Pulmonary:     Effort: Pulmonary effort is normal. No tachypnea, accessory muscle usage or respiratory distress.     Breath sounds: Normal breath sounds. No wheezing, rhonchi or rales.     Comments: Moving air well in all lung fields.  No increased work of breathing. Chest:     Chest wall: No tenderness.  Lymphadenopathy:     Cervical: No cervical adenopathy.  Skin:    General: Skin is warm.     Capillary Refill: Capillary refill takes less than 2 seconds.     Coloration: Skin is not pale.     Findings: No abrasion, erythema, petechiae or rash. Rash is not papular, urticarial or vesicular.     Comments: She does have some erythema on her face bilaterally, but most prominently around her right eye. There are excoriations present.   Neurological:     Mental Status: She is alert.  Psychiatric:        Behavior: Behavior is cooperative.       Diagnostic studies:    Spirometry: results normal (FEV1: 2.61/84%, FVC: 3.09/80%, FEV1/FVC: 84%).    Spirometry consistent with normal pattern.    Allergy Studies: none       Marty Shaggy, MD  [facility] 

## 2024-09-29 ENCOUNTER — Other Ambulatory Visit: Payer: Self-pay | Admitting: Allergy & Immunology

## 2024-10-06 DIAGNOSIS — J3089 Other allergic rhinitis: Secondary | ICD-10-CM | POA: Diagnosis not present

## 2024-10-06 DIAGNOSIS — J3081 Allergic rhinitis due to animal (cat) (dog) hair and dander: Secondary | ICD-10-CM | POA: Diagnosis not present

## 2024-10-06 DIAGNOSIS — J301 Allergic rhinitis due to pollen: Secondary | ICD-10-CM | POA: Diagnosis not present

## 2024-10-06 DIAGNOSIS — J302 Other seasonal allergic rhinitis: Secondary | ICD-10-CM | POA: Diagnosis not present

## 2024-10-06 NOTE — Progress Notes (Signed)
 VIALS MADE ON 10/06/24

## 2024-10-24 ENCOUNTER — Ambulatory Visit (INDEPENDENT_AMBULATORY_CARE_PROVIDER_SITE_OTHER)

## 2024-10-24 DIAGNOSIS — J302 Other seasonal allergic rhinitis: Secondary | ICD-10-CM | POA: Diagnosis not present

## 2024-12-24 ENCOUNTER — Ambulatory Visit: Admitting: Family Medicine

## 2025-05-28 ENCOUNTER — Telehealth: Admitting: Adult Health
